# Patient Record
Sex: Female | Born: 1975 | Race: Asian | Hispanic: No | Marital: Married | Smoking: Never smoker
Health system: Southern US, Community
[De-identification: ages and names within clinical notes are randomized; demographics above are authoritative.]

## PROBLEM LIST (undated history)

## (undated) ENCOUNTER — Emergency Department: Admission: EM | Payer: MEDICAID | Source: Home / Self Care

## (undated) DIAGNOSIS — D649 Anemia, unspecified: Secondary | ICD-10-CM

## (undated) DIAGNOSIS — M5020 Other cervical disc displacement, unspecified cervical region: Secondary | ICD-10-CM

## (undated) DIAGNOSIS — Z227 Latent tuberculosis: Secondary | ICD-10-CM

## (undated) DIAGNOSIS — E237 Disorder of pituitary gland, unspecified: Secondary | ICD-10-CM

## (undated) HISTORY — DX: Latent tuberculosis: Z22.7

## (undated) HISTORY — DX: Anemia, unspecified: D64.9

## (undated) HISTORY — DX: Other cervical disc displacement, unspecified cervical region: M50.20

## (undated) HISTORY — DX: Disorder of pituitary gland, unspecified: E23.7

## (undated) HISTORY — PX: DILATION AND CURETTAGE OF UTERUS: SHX78

---

## 2000-12-14 HISTORY — PX: LAPAROSCOPIC OOPHERECTOMY: SHX6507

## 2019-12-15 HISTORY — PX: OVARIAN CYST DRAINAGE: SHX325

## 2021-08-06 ENCOUNTER — Other Ambulatory Visit: Payer: Self-pay

## 2021-08-06 ENCOUNTER — Ambulatory Visit (LOCAL_COMMUNITY_HEALTH_CENTER): Payer: Self-pay

## 2021-08-06 VITALS — Wt 132.3 lb

## 2021-08-06 DIAGNOSIS — R7612 Nonspecific reaction to cell mediated immunity measurement of gamma interferon antigen response without active tuberculosis: Secondary | ICD-10-CM

## 2021-08-07 DIAGNOSIS — R7612 Nonspecific reaction to cell mediated immunity measurement of gamma interferon antigen response without active tuberculosis: Secondary | ICD-10-CM | POA: Insufficient documentation

## 2021-08-11 DIAGNOSIS — Z227 Latent tuberculosis: Secondary | ICD-10-CM | POA: Insufficient documentation

## 2021-08-11 NOTE — Progress Notes (Signed)
EPI completed via phone with Arabic interpeter [phone].  Referred by [facility] Patient had +Tspot and negative CXR.  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 Patient would like LTBI tx but has transportation problems. States has not had a menstrual period in 3-4 months but is not on [facility] and is sexually active with husband. Explained that patient cannot be pregnant while taking INH.  Rifampin is on a state shortage currently.  INH offered. Denies medical hx other than a herniated disc and pituitary gland problem that has caused her to lose some weight.  Patient reported this to [facility] also.   TB RN will reach out to Isabelle Course, patient's case manager with [employer] to see if she can assist with transportation.Richmond Campbell, RN   Case managers email returned. Richmond Campbell, RN

## 2021-08-15 ENCOUNTER — Telehealth: Payer: Self-pay

## 2021-08-15 NOTE — Telephone Encounter (Signed)
Note to call patient re: transportation for LTBI tx appts. Needs Arabic interpreter.

## 2021-08-28 ENCOUNTER — Other Ambulatory Visit: Payer: Self-pay | Admitting: Internal Medicine

## 2021-08-29 LAB — HGB A1C W/O EAG: Hgb A1c MFr Bld: 4.3 % — ABNORMAL LOW (ref 4.8–5.6)

## 2021-08-29 LAB — LIPID PANEL W/O CHOL/HDL RATIO
Cholesterol, Total: 57 mg/dL — ABNORMAL LOW (ref 100–199)
HDL: 57 mg/dL (ref >39)
Triglycerides: <9 mg/dL (ref 0–149)

## 2021-08-29 LAB — COMPREHENSIVE METABOLIC PANEL
ALT: 22 IU/L (ref 0–32)
AST: 26 IU/L (ref 0–40)
Albumin/Globulin Ratio: 2.4 — ABNORMAL HIGH (ref 1.2–2.2)
Albumin: 4.7 g/dL (ref 3.8–4.8)
Alkaline Phosphatase: 53 IU/L (ref 44–121)
BUN/Creatinine Ratio: 9 (ref 9–23)
BUN: 7 mg/dL (ref 6–24)
Bilirubin Total: 0.7 mg/dL (ref 0.0–1.2)
CO2: 28 mmol/L (ref 20–29)
Calcium: 9.4 mg/dL (ref 8.7–10.2)
Chloride: 104 mmol/L (ref 96–106)
Creatinine, Ser: 0.74 mg/dL (ref 0.57–1.00)
Globulin, Total: 2 g/dL (ref 1.5–4.5)
Glucose: 95 mg/dL (ref 65–99)
Potassium: 4.8 mmol/L (ref 3.5–5.2)
Sodium: 144 mmol/L (ref 134–144)
Total Protein: 6.7 g/dL (ref 6.0–8.5)
eGFR: 102 mL/min/{1.73_m2} (ref >59)

## 2021-08-29 LAB — CBC WITH DIFFERENTIAL/PLATELET
Basophils Absolute: 0 10*3/uL (ref 0.0–0.2)
Basos: 1 %
EOS (ABSOLUTE): 0.5 10*3/uL — ABNORMAL HIGH (ref 0.0–0.4)
Eos: 12 %
Hematocrit: 36 % (ref 34.0–46.6)
Hemoglobin: 12 g/dL (ref 11.1–15.9)
Immature Grans (Abs): 0 10*3/uL (ref 0.0–0.1)
Immature Granulocytes: 0 %
Lymphocytes Absolute: 1.4 10*3/uL (ref 0.7–3.1)
Lymphs: 36 %
MCH: 27.8 pg (ref 26.6–33.0)
MCHC: 33.3 g/dL (ref 31.5–35.7)
MCV: 83 fL (ref 79–97)
Monocytes Absolute: 0.3 10*3/uL (ref 0.1–0.9)
Monocytes: 8 %
Neutrophils Absolute: 1.8 10*3/uL (ref 1.4–7.0)
Neutrophils: 43 %
Platelets: 205 10*3/uL (ref 150–450)
RBC: 4.32 x10E6/uL (ref 3.77–5.28)
RDW: 14.6 % (ref 11.7–15.4)
WBC: 4 10*3/uL (ref 3.4–10.8)

## 2021-08-29 LAB — HEPATITIS C ANTIBODY: Hep C Virus Ab: <0.1 s/co ratio (ref 0.0–0.9)

## 2021-08-29 LAB — TSH: TSH: 3.15 u[IU]/mL (ref 0.450–4.500)

## 2021-08-29 LAB — T4, FREE: Free T4: 1.3 ng/dL (ref 0.82–1.77)

## 2021-08-29 LAB — VITAMIN D 25 HYDROXY (VIT D DEFICIENCY, FRACTURES): Vit D, 25-Hydroxy: 10.3 ng/mL — ABNORMAL LOW (ref 30.0–100.0)

## 2021-08-30 ENCOUNTER — Other Ambulatory Visit: Payer: Self-pay

## 2021-08-30 ENCOUNTER — Encounter: Payer: Self-pay | Admitting: Internal Medicine

## 2021-08-30 ENCOUNTER — Ambulatory Visit: Payer: Self-pay | Admitting: Internal Medicine

## 2021-08-30 DIAGNOSIS — E611 Iron deficiency: Secondary | ICD-10-CM

## 2021-08-30 DIAGNOSIS — E569 Vitamin deficiency, unspecified: Secondary | ICD-10-CM | POA: Insufficient documentation

## 2021-08-30 DIAGNOSIS — R109 Unspecified abdominal pain: Secondary | ICD-10-CM | POA: Insufficient documentation

## 2021-08-30 DIAGNOSIS — L80 Vitiligo: Secondary | ICD-10-CM

## 2021-08-30 DIAGNOSIS — R0789 Other chest pain: Secondary | ICD-10-CM

## 2021-08-30 DIAGNOSIS — F419 Anxiety disorder, unspecified: Secondary | ICD-10-CM | POA: Insufficient documentation

## 2021-08-30 DIAGNOSIS — R079 Chest pain, unspecified: Secondary | ICD-10-CM | POA: Insufficient documentation

## 2021-08-30 DIAGNOSIS — R0689 Other abnormalities of breathing: Secondary | ICD-10-CM | POA: Insufficient documentation

## 2021-08-30 DIAGNOSIS — R52 Pain, unspecified: Secondary | ICD-10-CM | POA: Insufficient documentation

## 2021-08-30 MED ORDER — OMEPRAZOLE 20 MG PO CPDR: 20.0000 mg | DELAYED_RELEASE_CAPSULE | Freq: Every day | ORAL | 3 refills | Status: AC

## 2021-08-30 MED ORDER — CITALOPRAM HYDROBROMIDE 10 MG PO TABS: 10.0000 mg | ORAL_TABLET | Freq: Every day | ORAL | 3 refills | Status: AC

## 2021-08-30 NOTE — Progress Notes (Signed)
   New Patient Office Visit  Subjective:  Patient ID: Whitney [name], female    DOB: [birth year]  Age: 45 y.o. MRN: [id number]  CC:  Chief Complaint  Patient presents with   Pain    Pt reports pain in many parts of the body(shoulders, legs, back, neck, etc.)    HPI Patient presents for anxiety, heart burn, chest pain, muscle tension  Past Medical History:  Diagnosis Date   Anemia     No current outpatient medications on file.   Past Surgical History:  Procedure Laterality Date   LAPAROSCOPIC OOPHERECTOMY Left 2002   OVARIAN CYST DRAINAGE  2021    History reviewed. No pertinent family history.  Social History   Socioeconomic History   Marital status: Married    Spouse name: Not on file   Number of children: Not on file   Years of education: Not on file   Highest education level: Not on file  Occupational History   Not on file  Tobacco Use   Smoking status: Never   Smokeless tobacco: Never  Substance and Sexual Activity   Alcohol use: Not on file   Drug use: Never   Sexual activity: Not on file  Other Topics Concern   Not on file  Social History Narrative   Not on file   Social Determinants of Health   Financial Resource Strain: Not on file  Food Insecurity: Not on file  Transportation Needs: Not on file  Physical Activity: Not on file  Stress: Not on file  Social Connections: Not on file  Intimate Partner Violence: Not on file    ROS Review of Systems  Constitutional: Negative.   Gastrointestinal:  Positive for abdominal pain.  Musculoskeletal:  Positive for back pain and neck pain.  Psychiatric/Behavioral:  Positive for behavioral problems.        Anxiety and depression   Objective:   Today's Vitals: BP 126/87 (BP Location: Left Arm, Patient Position: Sitting, Cuff Size: Normal)   Pulse 68   Temp 98.5 F (36.9 C) (Oral)   Ht 5\' 2"  (1.575 m)   Wt 130 lb 3.2 oz (59.1 kg)   LMP 04/13/2021 (Approximate)   SpO2 99%   BMI 23.81 kg/m    Physical Exam Constitutional:      Appearance: Normal appearance.  HENT:     Nose: Nose normal.  Cardiovascular:     Rate and Rhythm: Normal rate.    Assessment & Plan:   Problem List Items Addressed This Visit       Musculoskeletal and Integument   Vitiligo     Other   Pain   Low iron   Vitamin deficiency   Anxiety   Difficulty breathing   Chest pain    No outpatient encounter medications on file as of 08/30/2021.   No facility-administered encounter medications on file as of 08/30/2021.   Ordered vitamin D 5000 units, citalopram 10 mg a day, Prilosec   Follow-up: follow-up in 3 months   09/01/2021, MD

## 2021-10-09 ENCOUNTER — Encounter: Payer: Self-pay | Admitting: Obstetrics and Gynecology

## 2021-10-09 ENCOUNTER — Other Ambulatory Visit: Payer: Self-pay

## 2021-10-22 ENCOUNTER — Other Ambulatory Visit (HOSPITAL_COMMUNITY)
Admission: RE | Admit: 2021-10-22 | Discharge: 2021-10-22 | Disposition: A | Discharge status: Home / Self Care | Payer: Medicaid Other | Source: Ambulatory Visit | Attending: Obstetrics and Gynecology | Admitting: Obstetrics and Gynecology

## 2021-10-22 ENCOUNTER — Other Ambulatory Visit: Payer: Self-pay

## 2021-10-22 ENCOUNTER — Encounter: Payer: Self-pay | Admitting: Obstetrics & Gynecology

## 2021-10-22 ENCOUNTER — Ambulatory Visit (INDEPENDENT_AMBULATORY_CARE_PROVIDER_SITE_OTHER): Payer: Medicaid Other | Admitting: Obstetrics & Gynecology

## 2021-10-22 VITALS — BP 100/70 | Ht 64.96 in | Wt 129.0 lb

## 2021-10-22 DIAGNOSIS — R102 Pelvic and perineal pain: Secondary | ICD-10-CM

## 2021-10-22 DIAGNOSIS — Z124 Encounter for screening for malignant neoplasm of cervix: Secondary | ICD-10-CM | POA: Diagnosis present

## 2021-10-22 DIAGNOSIS — Z8742 Personal history of other diseases of the female genital tract: Secondary | ICD-10-CM

## 2021-10-22 NOTE — Progress Notes (Signed)
Gynecology Pelvic Pain Evaluation   Chief Complaint:  Chief Complaint  Patient presents with   Menstrual Problem   Vaginal Itching   Back Pain   Pelvic Pain    History of Present Illness:   Patient is a 45 y.o. [id number] who LMP was Patient's last menstrual period was 09/19/2021., presents today for a problem visit.  She complains of pain.   Her pain is localized to the RLQ, LLQ, and lower back  area, described as intermittent, dull, and aching, began a week ago and its severity is described as moderate. The pain radiates to the  Non-radiating. She has these associated symptoms which include bloating/abdominal distension and nausea. Patient has these modifiers which include nothing that make it better and unable to associate with any factor that make it worse.  Context includes: prior h/o ovarian cysts, prior surgery including an oophorectomy (unclear which side), prior NSVD x3 7 or more years ago, prior D&C.  New to .  Has irreg periods.  Not on birth control.  OK if became pregnant.  PMHx: She  has a past medical history of Anemia. Also,  has a past surgical history that includes Laparoscopic oopherectomy (Left, 2002); Ovarian cyst drainage (2021); and Dilation and curettage of uterus., family history is not on file.,  reports that she has never smoked. She has never used smokeless tobacco. She reports that she does not drink alcohol and does not use drugs.  She has a current medication list which includes the following prescription(s): citalopram and omeprazole. Also, has No Known Allergies.  Review of Systems  Constitutional:  Negative for chills, fever and malaise/fatigue.  HENT:  Negative for congestion, sinus pain and sore throat.   Eyes:  Negative for blurred vision and pain.  Respiratory:  Negative for cough and wheezing.   Cardiovascular:  Negative for chest pain and leg swelling.  Gastrointestinal:  Positive for abdominal pain. Negative for constipation, diarrhea, heartburn,  nausea and vomiting.  Genitourinary:  Negative for dysuria, frequency, hematuria and urgency.  Musculoskeletal:  Negative for back pain, joint pain, myalgias and neck pain.  Skin:  Negative for itching and rash.  Neurological:  Negative for dizziness, tremors and weakness.  Endo/Heme/Allergies:  Does not bruise/bleed easily.  Psychiatric/Behavioral:  Negative for depression. The patient is not nervous/anxious and does not have insomnia.    Objective: BP 100/70   Ht 5' 4.96" (1.65 m)   Wt 129 lb (58.5 kg)   LMP 09/19/2021   BMI 21.49 kg/m  Physical Exam Constitutional:      General: She is not in acute distress.    Appearance: She is well-developed.  Genitourinary:     Right Labia: No rash or tenderness.    Left Labia: No tenderness or rash.    No vaginal erythema or bleeding.      Right Adnexa: not tender and no mass present.    Left Adnexa: tender.    Left Adnexa: no mass present.    No cervical motion tenderness, discharge, polyp or nabothian cyst.     Uterus is not enlarged.     No uterine mass detected.    Pelvic exam was performed with patient in the lithotomy position.  HENT:     Head: Normocephalic and atraumatic.     Nose: Nose normal.  Abdominal:     General: There is no distension.     Palpations: Abdomen is soft.     Tenderness: There is no abdominal tenderness.  Musculoskeletal:  General: Normal range of motion.  Neurological:     Mental Status: She is alert and oriented to person, place, and time.     Cranial Nerves: No cranial nerve deficit.  Skin:    General: Skin is warm and dry.  Psychiatric:        Attention and Perception: Attention normal.        Mood and Affect: Mood and affect normal.        Speech: Speech normal.        Behavior: Behavior normal.        Thought Content: Thought content normal.        Judgment: Judgment normal.    Female chaperone present for pelvic portion of the physical exam  Assessment: 45 y.o. G3P3003   1.  Pelvic pain - assess for cysts, other Reassuring exam today for large mass - US PELVIC COMPLETE WITH TRANSVAGINAL; Future  2. History of ovarian cyst - US PELVIC COMPLETE WITH TRANSVAGINAL; Future  PAP today Normal exam Monitor pain  Discussed ovarian cysts and their management and follow up  Arabic translator present today   Barnett Applebaum, MD, [facility], [facility] 10/22/2021  3:37 PM

## 2021-10-24 ENCOUNTER — Encounter: Payer: Self-pay | Admitting: Obstetrics & Gynecology

## 2021-10-24 LAB — CYTOLOGY - PAP
Comment: NEGATIVE
Diagnosis: NEGATIVE
High risk HPV: NEGATIVE

## 2021-10-29 ENCOUNTER — Ambulatory Visit: Payer: Medicaid Other

## 2021-10-31 ENCOUNTER — Telehealth: Payer: Self-pay

## 2021-10-31 NOTE — Telephone Encounter (Signed)
Left vm to confirm 11/04/21 appointment-Toni 

## 2021-11-04 ENCOUNTER — Other Ambulatory Visit: Payer: Self-pay

## 2021-11-04 ENCOUNTER — Ambulatory Visit: Payer: Self-pay | Admitting: Nurse Practitioner

## 2021-11-12 ENCOUNTER — Ambulatory Visit: Payer: Medicaid Other | Admitting: Obstetrics & Gynecology

## 2021-11-13 ENCOUNTER — Ambulatory Visit
Admission: RE | Admit: 2021-11-13 | Discharge: 2021-11-13 | Disposition: A | Discharge status: Home / Self Care | Payer: Medicaid Other | Source: Ambulatory Visit | Attending: Obstetrics & Gynecology | Admitting: Obstetrics & Gynecology

## 2021-11-13 ENCOUNTER — Other Ambulatory Visit: Payer: Self-pay

## 2021-11-13 DIAGNOSIS — Z8742 Personal history of other diseases of the female genital tract: Secondary | ICD-10-CM | POA: Insufficient documentation

## 2021-11-13 DIAGNOSIS — R102 Pelvic and perineal pain: Secondary | ICD-10-CM | POA: Insufficient documentation

## 2021-12-02 ENCOUNTER — Ambulatory Visit: Payer: Self-pay | Admitting: Obstetrics & Gynecology

## 2021-12-03 ENCOUNTER — Encounter: Payer: Self-pay | Admitting: Obstetrics & Gynecology

## 2021-12-03 ENCOUNTER — Telehealth: Payer: Self-pay

## 2021-12-03 ENCOUNTER — Ambulatory Visit (INDEPENDENT_AMBULATORY_CARE_PROVIDER_SITE_OTHER): Payer: Medicaid Other | Admitting: Obstetrics & Gynecology

## 2021-12-03 ENCOUNTER — Other Ambulatory Visit: Payer: Self-pay

## 2021-12-03 VITALS — BP 122/70 | Wt 134.0 lb

## 2021-12-03 DIAGNOSIS — R102 Pelvic and perineal pain: Secondary | ICD-10-CM | POA: Diagnosis not present

## 2021-12-03 DIAGNOSIS — Z8742 Personal history of other diseases of the female genital tract: Secondary | ICD-10-CM

## 2021-12-03 DIAGNOSIS — N926 Irregular menstruation, unspecified: Secondary | ICD-10-CM | POA: Diagnosis not present

## 2021-12-03 NOTE — Progress Notes (Signed)
°  HPI: Pt was having RLQ but also flank and side pain.  Still having.  mIld- moderate.  No bleeding.  LMP Oct 8.  Has hot flashes.  Occas nausea.  No breast T.  Pt desires pregnancy.  Ultrasound demonstrates no masses seen, no cysts These findings are Pelvis normal  PMHx: She  has a past medical history of Anemia, Herniated cervical disc, Pituitary abnormality (HCC), and TB lung, latent. Also,  has a past surgical history that includes Laparoscopic oopherectomy (Left, 2002); Ovarian cyst drainage (2021); and Dilation and curettage of uterus., family history is not on file.,  reports that she has never smoked. She has never used smokeless tobacco. She reports that she does not drink alcohol and does not use drugs.  She has a current medication list which includes the following prescription(s): citalopram and omeprazole. Also, has No Known Allergies.  Review of Systems  All other systems reviewed and are negative.  Objective: BP 122/70    Wt 134 lb (60.8 kg)    BMI 22.33 kg/m   Physical examination Constitutional NAD, Conversant  Skin No rashes, lesions or ulceration.   Extremities: Moves all appropriately.  Normal ROM for age. No lymphadenopathy.  Neuro: Grossly intact  Psych: Oriented to PPT.  Normal mood. Normal affect.   No results found.  Assessment:  History of ovarian cyst  Pelvic pain  Irregular periods - Plan: TSH, FSH/LH, Beta hCG quant (ref lab), Estradiol  No GYN source for right sided pain by exams and [place]. Plans f/u w a (new) PCP Also discussed periods and menopause and fertility (as she desires).  Will check labs for such.  With her oligomenorrhea and hot flashes, low likelihood for pregnancy counseled to pt.   Interpreter utilized throughout this visit.  A total of 30 minutes were spent face-to-face with the patient as well as preparation, review, communication, and documentation during this encounter.   Annamarie Major, MD, [facility], [facility] 12/03/2021  11:27 AM

## 2021-12-03 NOTE — Telephone Encounter (Signed)
None.  No hormones desired.  Waiting on labs.  No pain medicine as no gyn cause for pain.  To see PCP next week.

## 2021-12-03 NOTE — Telephone Encounter (Signed)
Pt wondering if there was any medication that [facility] was going to send in from todays visit. Please advise when you can

## 2021-12-03 NOTE — Telephone Encounter (Signed)
Lm with pt

## 2021-12-04 LAB — FSH/LH
FSH: 88.8 m[IU]/mL
LH: 64.2 m[IU]/mL

## 2021-12-04 LAB — ESTRADIOL: Estradiol: <5 pg/mL

## 2021-12-04 LAB — BETA HCG QUANT (REF LAB): hCG Quant: <1 m[IU]/mL

## 2021-12-04 LAB — TSH: TSH: 2.05 u[IU]/mL (ref 0.450–4.500)

## 2021-12-04 NOTE — Progress Notes (Signed)
Need lab results communicated to patient.  Will require language line of some sort to communicate.  If can be set up with you there, then let her know labs are normal, and she is indeed peri-menopausal and very unlikely to achieve pregnancy.

## 2022-01-05 ENCOUNTER — Other Ambulatory Visit: Payer: Self-pay | Admitting: Obstetrics & Gynecology

## 2022-01-13 ENCOUNTER — Ambulatory Visit: Payer: Self-pay | Admitting: Nurse Practitioner

## 2022-01-20 ENCOUNTER — Ambulatory Visit (LOCAL_COMMUNITY_HEALTH_CENTER): Payer: Medicaid Other

## 2022-01-20 ENCOUNTER — Other Ambulatory Visit: Payer: Self-pay

## 2022-01-20 DIAGNOSIS — Z3202 Encounter for pregnancy test, result negative: Secondary | ICD-10-CM | POA: Diagnosis not present

## 2022-01-20 DIAGNOSIS — Z719 Counseling, unspecified: Secondary | ICD-10-CM

## 2022-01-20 DIAGNOSIS — Z23 Encounter for immunization: Secondary | ICD-10-CM | POA: Diagnosis not present

## 2022-01-20 LAB — PREGNANCY, URINE: Preg Test, Ur: NEGATIVE

## 2022-01-20 NOTE — Progress Notes (Signed)
Presented for vaccines need today.  Interpretation provided by [employer] Number [phone] Merna. Patient. reported allergy to tomato, spices, Reported no allergy to egg.  LMP;  4 months ago, but reported that it is normal for her not to have her period because of hormonal issues.   Patient stated several times that she is not pregnant, and that she is sure she is not pregnant.  Consulted with Dr. Alvester Morin regarding LMP.  Dr. Alvester Morin confirm she could receive Hep B, Tdap and Flu Vaccine, but strongly recommends Pregnancy test.  2nd Call place to language line.  Interpretation by badge number [phone].  Patient consent to pregnancy test.  Test results negative.  VIS given to family in arabic.  Vaccines tolerated well. NCIR Updated and copy given to family.     Heinz Eckert R. Edan Serratore Charity fundraiser, Scientist, research (physical sciences)

## 2022-01-30 NOTE — Progress Notes (Signed)
Attestation of Attending Supervision of clinical support staff: I agree with the care provided to this patient and was available for any consultation.  I have reviewed the RN's note and chart. I was available for consult and to see the patient if needed.   Oakes Mccready Niles Dorean Hiebert, MD, MPH, ABFM Attending Physician Faculty Practice- [facility]  

## 2022-02-10 ENCOUNTER — Emergency Department
Admission: EM | Admit: 2022-02-10 | Discharge: 2022-02-10 | Disposition: A | Discharge status: Home / Self Care | Payer: Medicaid Other | Attending: Emergency Medicine | Admitting: Emergency Medicine

## 2022-02-10 ENCOUNTER — Encounter: Payer: Self-pay | Admitting: Emergency Medicine

## 2022-02-10 ENCOUNTER — Other Ambulatory Visit: Payer: Self-pay

## 2022-02-10 DIAGNOSIS — H6983 Other specified disorders of Eustachian tube, bilateral: Secondary | ICD-10-CM

## 2022-02-10 DIAGNOSIS — H6993 Unspecified Eustachian tube disorder, bilateral: Secondary | ICD-10-CM | POA: Diagnosis not present

## 2022-02-10 DIAGNOSIS — H9203 Otalgia, bilateral: Secondary | ICD-10-CM | POA: Diagnosis present

## 2022-02-10 MED ORDER — PREDNISONE 10 MG PO TABS: ORAL_TABLET | ORAL | 0 refills | Status: AC

## 2022-02-10 MED ORDER — FLUTICASONE PROPIONATE 50 MCG/ACT NA SUSP: 2.0000 | Freq: Every day | NASAL | 0 refills | Status: AC

## 2022-02-10 NOTE — Discharge Instructions (Addendum)
Make an appoint with Dr. Tami Ribas about your ears. Begin taking medication that was sent to the pharmacy. You may continue with the amoxicillin and Tylenol.  OPTIONS FOR DENTAL FOLLOW UP CARE  [place] [facility] and [place] [employer] [phone])  [facility] [phone])  [place] [phone])  [place] [phone])  [facility] [phone]) This clinic caters to the indigent population and is on a lottery system. Location: [employer] [facility], [place], [place], [place] Clinic Hours: Wednesdays from 6pm - 9pm, patients seen by a lottery system. For dates, call or go to [email or web address] Services: Cleanings, fillings and simple extractions. Payment Options: DENTAL WORK IS FREE OF CHARGE. Bring proof of income or support. Best way to get seen: Arrive at 5:15 pm - this is a lottery, NOT first come/first serve, so arriving earlier will not increase your chances of being seen.     [facility] [phone] Select option 1 for emergencies   Location: [facility], [place], [address], [place] Clinic Hours: No walk-ins accepted - call the day before to schedule an appointment. Check in times are 9:30 am and 1:30 pm. Services: Simple extractions, temporary fillings, pulpectomy/pulp debridement, uncomplicated abscess drainage. Payment Options: PAYMENT IS DUE AT THE TIME OF SERVICE.  Fee is usually $100-200, additional surgical procedures (e.g. abscess drainage) may be extra. Cash, checks, Visa/MasterCard accepted.  Can file Medicaid if patient is covered for dental - patient should call case worker to check. No discount for [facility] patients. Best way to get seen: MUST call the  day before and get onto the schedule. Can usually be seen the next 1-2 days. No walk-ins accepted.     [place] [phone]   Location: [place], [place] Clinic Hours: M, W, Th, F 8am or 1:30pm, Tues 9a or 1:30 - first come/first served. Services: Simple extractions, temporary fillings, uncomplicated abscess drainage.  You do not need to be an [facility] resident. Payment Options: PAYMENT IS DUE AT THE TIME OF SERVICE. Dental insurance, otherwise sliding scale - bring proof of income or support. Depending on income and treatment needed, cost is usually $50-200. Best way to get seen: Arrive early as it is first come/first served.     [facility] [phone]   Location: [place] Clinic Hours: Mon-Thu 8a-5p Services: Most basic dental services including extractions and fillings. Payment Options: PAYMENT IS DUE AT THE TIME OF SERVICE. Sliding scale, up to 50% off - bring proof if income or support. Medicaid with dental option accepted. Best way to get seen: Call to schedule an appointment, can usually be seen within 2 weeks OR they will try to see walk-ins - show up at [place] or 2p (you may have to wait).     [facility] [place] RESIDENTS ONLY   Location: [facility], [place] [address] [place],  [address] Clinic Hours: By appointment only. Monday - Thursday 8am-5pm, Friday 8am-12pm Services: Cleanings, fillings, extractions. Payment Options: PAYMENT IS DUE AT THE TIME OF SERVICE. Cash, Visa or MasterCard. Sliding scale - $30 minimum per service. Best way to get seen: Come in to office, complete packet and make an appointment - need proof of income or support monies for each household member and proof of  [facility] residence. Usually takes about a month to get in.     [facility] [phone]   Location: [address], [place] Clinic Hours: Walk-in Urgent Care Dental Services are offered Monday-Friday mornings only. The numbers of emergencies accepted daily is limited to the number of providers available. Maximum 15 - Mondays, Wednesdays & Thursdays Maximum 10 - Tuesdays & Fridays Services: You do not need to be a [facility] resident to be seen for a dental emergency. Emergencies are defined as pain, swelling, abnormal bleeding, or dental trauma. Walkins will receive x-rays if needed. NOTE: Dental cleaning is not an emergency. Payment Options: PAYMENT IS DUE AT THE TIME OF SERVICE. Minimum co-pay is $40.00 for uninsured patients. Minimum co-pay is $3.00 for Medicaid with dental coverage. Dental Insurance is accepted and must be presented at time of visit. Medicare does not cover dental. Forms of payment: Cash, credit card, checks. Best way to get seen: If not previously registered with the clinic, walk-in dental registration begins at 7:15 am and is on a first come/first serve basis. If previously registered with the clinic, call to make an appointment.     [facility] [place] ONLY   Location: [address] [place], [place] Clinic Hours: Mon-Thu 10a-2p Services: Extractions only! Payment Options: FREE (donations accepted) - bring proof of income or support Best way to get seen: Call and schedule an appointment OR come at 8am on the 1st Monday of every month (except for holidays) when it is first come/first served.     [facility] [phone]   Location: [place], [place] Clinic Hours: Friday mornings Services, Payment Options, Best way to get seen: Call for info

## 2022-02-10 NOTE — ED Notes (Signed)
See triage note  presents with left ear pain  states pain started about 2 weeks ago    thinks it is worse  also has headache  no fever

## 2022-02-10 NOTE — ED Triage Notes (Signed)
Pt here with left ear pain that started 2 weeks ago. Pt also has a headache and states she feels clogged like she cannot hear well. Pt states she went to a clinic and they told her that she needs to have fluid drained from her ear. Pt in NAD in triage.

## 2022-02-10 NOTE — ED Provider Notes (Signed)
[facility] Provider Note    Event Date/Time   First MD Initiated Contact with Patient 02/10/22 0920     (approximate)   History   Otalgia   HPI Arabic interpreter via [employer]  Whitney [name] is a 46 y.o. female presents to the ED with complaint of bilateral ear pain although the left ear hurts worse than the right.  Patient states that this started 2 weeks ago.  Patient states that her ears feel clogged and she has a headache plus hearing has decreased.  She reports that she went to a clinic where they gave her drops to place in her ear when this did not work she return to the clinic and they placed her on antibiotics which also are not helping.  She was told that she would need to see a specialist who would stick a needle in her ear and drawl the fluid off her ear.  Patient has a history of latent TB, pituitary abnormality, herniated cervical disc and anemia.  Currently she rates her pain as a 10/10.      Physical Exam   Triage Vital Signs: ED Triage Vitals  Enc Vitals Group     BP 02/10/22 0914 126/80     Pulse Rate 02/10/22 0914 75     Resp 02/10/22 0914 16     Temp 02/10/22 0914 98.6 F (37 C)     Temp Source 02/10/22 0914 Oral     SpO2 02/10/22 0914 100 %     Weight 02/10/22 0915 132 lb 4.4 oz (60 kg)     Height 02/10/22 0915 5\' 4"  (1.626 m)     Head Circumference --      Peak Flow --      Pain Score 02/10/22 0914 10     Pain Loc --      Pain Edu? --      Excl. in GC? --     Most recent vital signs: Vitals:   02/10/22 0914  BP: 126/80  Pulse: 75  Resp: 16  Temp: 98.6 F (37 C)  SpO2: 100%     General: Awake, no distress.  CV:  Good peripheral perfusion.  Heart regular rate and rhythm without murmur. Resp:  Normal effort.  Lungs are clear bilaterally. Abd:  No distention.  Other:  EACs are clear.  TMs are dull with poor light reflex but no erythema or injection is noted.  Patient reports that she also feels fluid in her ears  as it "bubbles".   ED Results / Procedures / Treatments   Labs (all labs ordered are listed, but only abnormal results are displayed) Labs Reviewed - No data to display    PROCEDURES:  Critical Care performed:   Procedures   MEDICATIONS ORDERED IN ED: Medications - No data to display   IMPRESSION / MDM / ASSESSMENT AND PLAN / ED COURSE  I reviewed the triage vital signs and the nursing notes.   Differential diagnosis includes, but is not limited to, otitis media, serous otitis, eustachian tube dysfunction.  46 year old female presents to the ED with complaint of bilateral ear pain for approximately 2 weeks.  Patient has been using prescribed eardrops and antibiotics without any relief.  She describes it as a fluid type bubbling sensation with decreased hearing.  On exam there is no evidence of otitis media and patient was made aware.  We discussed in length that the treatment at this point is for prednisone and Flonase steroid nasal  spray to help open up the eustachian tube which should help.  She was told that she could continue taking the antibiotic that she just recently got filled however the Cortisporin otic suspension will not help with her ears.  She was also given contact information for Dr. Jenne Campus who is on-call for ENT as she wants to still see a specialist about her hearing.        FINAL CLINICAL IMPRESSION(S) / ED DIAGNOSES   Final diagnoses:  Eustachian tube dysfunction, bilateral     Rx / DC Orders   ED Discharge Orders          Ordered    predniSONE (DELTASONE) 10 MG tablet        02/10/22 1112    fluticasone (FLONASE) 50 MCG/ACT nasal spray  Daily        02/10/22 1112             Note:  This document was prepared using Dragon voice recognition software and may include unintentional dictation errors.   Tommi Rumps, PA-C 02/10/22 1311    Arnaldo Natal, MD 02/10/22 [phone]

## 2022-02-14 ENCOUNTER — Encounter: Payer: Self-pay | Admitting: Internal Medicine

## 2022-02-14 ENCOUNTER — Ambulatory Visit: Payer: Self-pay | Admitting: Internal Medicine

## 2022-02-14 ENCOUNTER — Other Ambulatory Visit: Payer: Self-pay

## 2022-02-14 ENCOUNTER — Other Ambulatory Visit: Payer: Self-pay | Admitting: Internal Medicine

## 2022-02-14 DIAGNOSIS — H65193 Other acute nonsuppurative otitis media, bilateral: Secondary | ICD-10-CM

## 2022-02-14 DIAGNOSIS — R042 Hemoptysis: Secondary | ICD-10-CM | POA: Insufficient documentation

## 2022-02-14 DIAGNOSIS — H6690 Otitis media, unspecified, unspecified ear: Secondary | ICD-10-CM

## 2022-02-14 DIAGNOSIS — R454 Irritability and anger: Secondary | ICD-10-CM

## 2022-02-14 DIAGNOSIS — L299 Pruritus, unspecified: Secondary | ICD-10-CM

## 2022-02-14 MED ORDER — CIPROFLOXACIN-DEXAMETHASONE 0.3-0.1 % OT SUSP: 4.0000 [drp] | Freq: Two times a day (BID) | OTIC | 0 refills | Status: DC

## 2022-02-14 MED ORDER — CIPROFLOXACIN-DEXAMETHASONE 0.3-0.1 % OT SUSP: 4.0000 [drp] | Freq: Two times a day (BID) | OTIC | Status: DC

## 2022-02-14 NOTE — Progress Notes (Signed)
? ?Established Patient Office Visit ? ?Subjective:  ?Patient ID: Whitney [name], female    DOB: [birth year]  Age: 46 y.o. MRN: [id number] ? ?CC:  ?Chief Complaint  ?Patient presents with  ? Sinus Problem  ? Sinusitis  ? GI Problem  ?  Occurring for a week  ? ? ?HPI ?Whitney [name] presents for bilateral ear pain and drainage including blood.Left ear pain chronic but right more recent onset. Admits to nasal congestion, facial fullness as well and loss of hearing. Went to [facility] last week and prescribed PSE and amoxicillin but states it'Teagen Bucio ineffective. ? ?Past Medical History:  ?Diagnosis Date  ? Anemia   ? Herniated cervical disc   ? 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 TB lung, latent   ? ? ?Past Surgical History:  ?Procedure Laterality Date  ? DILATION AND CURETTAGE OF UTERUS    ? LAPAROSCOPIC OOPHERECTOMY Left 2002  ? OVARIAN CYST DRAINAGE  2021  ? ? ?History reviewed. No pertinent family history. ? ?Social History  ? ?Socioeconomic History  ? Marital status: Married  ?  Spouse name: Not on file  ? Number of children: Not on file  ? Years of education: Not on file  ? Highest education level: Not on file  ?Occupational History  ? Not on file  ?Tobacco Use  ? Smoking status: Never  ? Smokeless tobacco: Never  ?Vaping Use  ? Vaping Use: Never used  ?Substance and Sexual Activity  ? Alcohol use: Never  ? Drug use: Never  ? Sexual activity: Yes  ?  Partners: Male  ?  Birth control/protection: None  ?Other Topics Concern  ? Not on file  ?Social History Narrative  ? ** Merged History Encounter **  ?    ? ?Social Determinants of Health  ? ?Financial Resource Strain: Not on file  ?Food Insecurity: Not on file  ?Transportation Needs: Not on file  ?Physical Activity: Not on file  ?Stress: Not on file  ?Social Connections: Not on file  ?Intimate Partner Violence: Not on file  ? ? ?Outpatient Medications Prior to Visit  ?Medication Sig Dispense Refill  ? amoxicillin (AMOXIL) 500 MG capsule Take 500 mg by mouth 3 (three) times  daily.    ? neomycin-bacitracin-polymyxin (NEOSPORIN) OINT Apply 1 application topically in the morning, at noon, and at bedtime. 3 drops each ear, in the morning then again at noon then again in the evening then again before bed. For 10 days.    ? citalopram (CELEXA) 10 MG tablet Take 1 tablet (10 mg total) by mouth daily. (Patient not taking: Reported on 10/22/2021) 30 tablet 3  ? fluticasone (FLONASE) 50 MCG/ACT nasal spray Place 2 sprays into both nostrils daily. 16 mL 0  ? omeprazole (PRILOSEC) 20 MG capsule Take 1 capsule (20 mg total) by mouth daily. (Patient not taking: Reported on 10/22/2021) 30 capsule 3  ? predniSONE (DELTASONE) 10 MG tablet Take 6 tablets  today, on day 2 take 5 tablets, day 3 take 4 tablets, day 4 take 3 tablets, day 5 take  2 tablets and 1 tablet the last day 21 tablet 0  ? ?No facility-administered medications prior to visit.  ? ? ?No Known Allergies ? ?ROS ?Review of Systems  ?All other systems reviewed and are negative. ? ?  ?Objective:  ?  ?Physical Exam ?HENT:  ?   Right Ear: Hearing normal. There is no impacted cerumen.  ?   Left Ear: Hearing normal. There is no impacted cerumen.  ?  Ears:  ?   Comments: Right tm with blood in ear canal ?Left tm minimal inflammation ?Neurological:  ?   Mental Status: She is alert.  ? ? ?BP 111/78 (BP Location: Left Arm, Patient Position: Sitting, Cuff Size: Normal)   Pulse 71   Temp 98.6 ?F (37 ?C)   Ht 5' 3"  (1.6 m)   Wt 133 lb 6.4 oz (60.5 kg)   SpO2 98%   BMI 23.63 kg/m?  ?Wt Readings from Last 3 Encounters:  ?02/14/22 133 lb 6.4 oz (60.5 kg)  ?02/10/22 132 lb 4.4 oz (60 kg)  ?12/03/21 134 lb (60.8 kg)  ? ? ? ?Health Maintenance Due  ?Topic Date Due  ? HIV Screening  Never done  ? ? ?There are no preventive care reminders to display for this patient. ? ?Lab Results  ?Component Value Date  ? TSH 2.050 12/03/2021  ? ?Lab Results  ?Component Value Date  ? WBC 4.0 08/28/2021  ? HGB 12.0 08/28/2021  ? HCT 36.0 08/28/2021  ? MCV 83 08/28/2021   ? PLT 205 08/28/2021  ? ?Lab Results  ?Component Value Date  ? NA 144 08/28/2021  ? K 4.8 08/28/2021  ? CO2 28 08/28/2021  ? GLUCOSE 95 08/28/2021  ? BUN 7 08/28/2021  ? CREATININE 0.74 08/28/2021  ? BILITOT 0.7 08/28/2021  ? ALKPHOS 53 08/28/2021  ? AST 26 08/28/2021  ? ALT 22 08/28/2021  ? PROT 6.7 08/28/2021  ? ALBUMIN 4.7 08/28/2021  ? CALCIUM 9.4 08/28/2021  ? EGFR 102 08/28/2021  ? ?Lab Results  ?Component Value Date  ? CHOL 57 (L) 08/28/2021  ? ?Lab Results  ?Component Value Date  ? HDL 57 08/28/2021  ? ?Lab Results  ?Component Value Date  ? [place] CANCELED 08/28/2021  ? ?Lab Results  ?Component Value Date  ? TRIG <9 08/28/2021  ? ?No results found for: CHOLHDL ?Lab Results  ?Component Value Date  ? HGBA1C 4.3 (L) 08/28/2021  ? ? ?  ?Assessment & Plan:  ? ?Problem List Items Addressed This Visit   ? ?  ? Other  ? Coughing up blood  ? Irritability and anger  ? Itching  ? ? ?No orders of the defined types were placed in this encounter. ? ? ?Follow-up: No follow-ups on file.  ? ? ?Volanda Napoleon, MD ?

## 2022-02-14 NOTE — Addendum Note (Signed)
Addended by: Orville Govern on: 02/14/2022 03:13 PM ? ? Modules accepted: Orders ? ?

## 2022-04-30 ENCOUNTER — Ambulatory Visit: Payer: Medicaid Other | Admitting: Nurse Practitioner

## 2022-06-09 ENCOUNTER — Ambulatory Visit: Payer: Medicaid Other | Admitting: Nurse Practitioner

## 2022-06-11 ENCOUNTER — Ambulatory Visit: Payer: Medicaid Other | Admitting: Internal Medicine

## 2022-06-15 ENCOUNTER — Encounter: Payer: Self-pay | Admitting: Internal Medicine

## 2022-06-15 ENCOUNTER — Ambulatory Visit: Payer: Medicaid Other | Admitting: Internal Medicine

## 2022-06-15 VITALS — BP 106/70 | HR 73 | Temp 97.9°F | Resp 16 | Ht 61.25 in | Wt 127.4 lb

## 2022-06-15 DIAGNOSIS — L259 Unspecified contact dermatitis, unspecified cause: Secondary | ICD-10-CM

## 2022-06-15 DIAGNOSIS — H1010 Acute atopic conjunctivitis, unspecified eye: Secondary | ICD-10-CM

## 2022-06-15 DIAGNOSIS — L80 Vitiligo: Secondary | ICD-10-CM

## 2022-06-15 DIAGNOSIS — J309 Allergic rhinitis, unspecified: Secondary | ICD-10-CM | POA: Diagnosis not present

## 2022-06-15 DIAGNOSIS — M25561 Pain in right knee: Secondary | ICD-10-CM

## 2022-06-15 DIAGNOSIS — N926 Irregular menstruation, unspecified: Secondary | ICD-10-CM

## 2022-06-15 DIAGNOSIS — K219 Gastro-esophageal reflux disease without esophagitis: Secondary | ICD-10-CM

## 2022-06-15 DIAGNOSIS — G8929 Other chronic pain: Secondary | ICD-10-CM

## 2022-06-15 HISTORY — DX: Vitiligo: L80

## 2022-06-15 LAB — POCT PREGNANCY, URINE

## 2022-06-15 MED ORDER — BETAMETHASONE DIPROPIONATE AUG 0.05 % EX OINT: TOPICAL_OINTMENT | CUTANEOUS | 1 refills | Status: AC

## 2022-06-15 MED ORDER — FAMOTIDINE 20 MG PO TABS: 20.0000 mg | ORAL_TABLET | Freq: Two times a day (BID) | ORAL | 1 refills | Status: AC

## 2022-06-15 MED ORDER — LORATADINE 10 MG PO TABS: ORAL_TABLET | ORAL | 11 refills | Status: AC

## 2022-06-15 MED ORDER — DICLOFENAC SODIUM 1 % EX GEL: 2.0000 g | Freq: Two times a day (BID) | CUTANEOUS | 3 refills | Status: AC | PRN

## 2022-06-15 MED ORDER — MONTELUKAST SODIUM 10 MG PO TABS: 10.0000 mg | ORAL_TABLET | Freq: Every day | ORAL | 3 refills | Status: AC

## 2022-06-18 ENCOUNTER — Encounter: Payer: Self-pay | Admitting: Internal Medicine

## 2022-07-07 ENCOUNTER — Ambulatory Visit (INDEPENDENT_AMBULATORY_CARE_PROVIDER_SITE_OTHER): Payer: Medicaid Other | Admitting: Family Medicine

## 2022-07-07 ENCOUNTER — Encounter: Payer: Self-pay | Admitting: Family Medicine

## 2022-07-07 VITALS — BP 108/70 | Wt 129.0 lb

## 2022-07-07 DIAGNOSIS — N644 Mastodynia: Secondary | ICD-10-CM

## 2022-07-07 DIAGNOSIS — N926 Irregular menstruation, unspecified: Secondary | ICD-10-CM | POA: Diagnosis not present

## 2022-07-07 DIAGNOSIS — N3000 Acute cystitis without hematuria: Secondary | ICD-10-CM

## 2022-07-07 DIAGNOSIS — R102 Pelvic and perineal pain: Secondary | ICD-10-CM

## 2022-07-07 DIAGNOSIS — Z1231 Encounter for screening mammogram for malignant neoplasm of breast: Secondary | ICD-10-CM

## 2022-07-07 LAB — POCT URINALYSIS DIPSTICK
Bilirubin, UA: NEGATIVE
Blood, UA: NEGATIVE
Glucose, UA: NEGATIVE
Ketones, UA: POSITIVE
Nitrite, UA: NEGATIVE
Protein, UA: NEGATIVE
Spec Grav, UA: 1.01 (ref 1.010–1.025)
Urobilinogen, UA: 0.2 E.U./dL
pH, UA: 6 (ref 5.0–8.0)

## 2022-07-07 MED ORDER — MEDROXYPROGESTERONE ACETATE 10 MG PO TABS: 10.0000 mg | ORAL_TABLET | Freq: Every day | ORAL | 2 refills | Status: DC

## 2022-07-07 MED ORDER — LETROZOLE 2.5 MG PO TABS: 2.5000 mg | ORAL_TABLET | Freq: Every day | ORAL | 2 refills | Status: AC

## 2022-07-07 NOTE — Progress Notes (Signed)
  GYNECOLOGY PROBLEM  VISIT ENCOUNTER NOTE  Subjective:   Whitney [name] is a 46 y.o. [phone]  female here for a problem GYN visit.  Current complaints: multiple things  # pelvic pain: R>L for years. Reports also associated with back pain. Reports more with cycle.   Denies abnormal vaginal bleeding, discharge, pelvic pain, problems with intercourse or other gynecologic concerns.   #irregular cycles: has had these for years and required medication to get pregnant. Reports prior to July menses it had been 3 months since last cycle. She does desires another pregnancy.   #fertility- desires another pregnancy. Husband had to take medication as well to increase sperm count. She reports taking a medication to stimulate ovulation for all her pregnancies. She has had three uncomplicated pregnancies otherwise, with three vaginal births.   # breast pain/itching- happens with her menstrual cycle. For her whole life.   Utilized the help of arabic interpreter Maralyn Sago throughout visit   Gynecologic History Patient's last menstrual period was 06/19/2022 (exact date).  Contraception: none  Health Maintenance Due  Topic Date Due   HIV Screening  Never done   COVID-19 Vaccine (3 - Pfizer series) 04/06/2021    The following portions of the patient's history were reviewed and updated as appropriate: allergies, current medications, past family history, past medical history, past social history, past surgical history and problem list.  Review of Systems Pertinent items are noted in HPI.   Objective:  BP 108/70   Wt 129 lb (58.5 kg)   LMP 06/19/2022 (Exact Date)   BMI 24.18 kg/m  Gen: well appearing, NAD HEENT: no scleral icterus CV: RR Lung: Normal WOB Ext: warm well perfused  PELVIC: chaperone present. Normal appearing external genitalia; normal appearing vaginal mucosa and cervix.  No abnormal discharge noted.  .  Normal uterine size, no other palpable masses, no uterine or adnexal  tenderness.   Assessment and Plan:  1. Acute cystitis without hematuria - POCT Urinalysis Dipstick  2. Irregular periods/menstrual cycles Reviewed in detail how to take medications Also reporte that her husband needed medication to achieve pregnancy in the past. Given PCP list for him to reach out for services Discussed tracking cycles and bleeding Recommended urine pregnancy test after stimulating ovulation - medroxyPROGESTERone (PROVERA) 10 MG tablet; Take 1 tablet (10 mg total) by mouth daily. Use for ten days  Dispense: 10 tablet; Refill: 2 - letrozole (FEMARA) 2.5 MG tablet; Take 1 tablet (2.5 mg total) by mouth daily. Take on days 3 to 7 following a spontaneous menses or progestin-induced bleed.  Dispense: 5 tablet; Refill: 2  3. Pelvic pain - US PELVIC COMPLETE WITH TRANSVAGINAL; Future  4. Breast pain - MM 3D SCREEN BREAST BILATERAL; Future  5. Screening mammogram for breast cancer - MM 3D SCREEN BREAST BILATERAL; Future  Please refer to After Visit Summary for other counseling recommendations.   Face to face time:  45 min  Greater than 50% of the visit time was spent in counseling and coordination of care with the patient.  The summary and outline of the counseling and care coordination is summarized in the note above.  All questions were answered.  Return in about 3 months (around 10/07/2022) for f/u fertility and irregular cycles.  Federico Flake, MD, MPH, ABFM Attending Physician Faculty Practice- [facility]

## 2022-07-07 NOTE — Patient Instructions (Addendum)
AREA FAMILY PRACTICE PHYSICIANS  [place] ([address]) [facility] [address] [place], [place] [address] [phone] Mon-Fri 8:30-12:30, 1:30-5:00 Accepting Medicaid [facility] [address], [place], [place] [address] [phone] Mon-Fri 8:00-5:30 [facility] [address] [place], [place] [address] [phone] Mon, Tue, Thur, Fri 8:30-5:00, Wed 10:00-7:00 (closed 1-2pm) Accepting Medicaid [facility] [address], [place], [place]  [address] Phone - [phone]   Fax - [phone]  [place] ([address]) [facility] [address] [place], [place] [address] [phone] Mon-Fri 8:00-5:00 [facility] ([facility])  [address] [place], [place] [address] [phone] Mon-Fri 8:30-5:30, Sat (Oct.-Mar.) 9:00-1:00 Accepting Medicaid  [place] ([address]) [facility] [address], [place], [place] [address] [phone] Mon-Fri 8:00-5:00  [place] ([address]) [facility] [address], [place], [place] [address] [phone] Mon-Fri 8:00-5:00 [facility] [address], [place], [place] [address] [phone] Mon-Fri 8:00-5:00 [facility] [address] [place], [place] [address] [phone] Mon-Fri 8:00-5:00 [facility] [address] [place] [address] [phone] Mon-Fri 7:30-5:30  [place] ([address] & [address]) [facility] [address] [place], [place] [address] [phone] Mon-Thur 8:00-6:00 Accepting Medicaid [facility] [address] [place], [place] [address] [phone] Mon-Thur 7:30-7:30, Fri 7:30-4:30 Accepting  Medicaid [facility] [address], [place], [place]  [address] [phone]   Fax - [phone]  [place] ([address] & [address]) [facility] [address] [place], [place] [address] [phone] Mon-Fri 7:00-5:00 [facility] [address], [place], [place] [address] [phone] Mon-Fri 8:00-5:00 Accepting Medicaid [facility] [address], [place], [place] [address] [phone] Mon-Fri 8:00-5:00 Accepting Medicaid  [place] ([address]) [facility] [place] [address] [place], [place] [address] [phone] Mon-Fri 8:00-5:00 [facility] [address], [place], [place] [address] [phone] Mon-Fri 8:00-5:00 Accepting Medicaid [facility] ([facility] [address], [place], [place] [address] [phone] Mon-Fri 8:00-5:30, Sat&Sun by appointment (phones open at 8:30) Accepting Medicaid  [place] ([address]) [facility] [address] [place], [place] [address] [phone] Mon-Thur 8:00-7:00, Fri 8:00-5:00, Sat 8:00-12:00, Sun 9:00-12:00 Accepting Medicaid [facility] [address], [place], [place] [address] [phone] Mon-Thur 8:00-5:00 Accepting Medicaid [facility] [address] [place], [place] [address] [phone] Mon-Fri 8:00-5:30, Sat (Oct.-Mar.) 9:00-1:00 Accepting Medicaid  [place] ([address]) [facility] [address], [place], [place] [address] [phone] Mon-Fri 8:00-5:00 Accepting Medicaid   [place] ([address]) [facility] [place] [address], [place], [place] [address] [phone] Mon-Fri 8:00-5:00 [facility] [place] [address], [place], [place] [address] [phone] Mon-Fri 8:00-5:00 [facility] [place] [address] [place] [address], [place], [place] [address] [phone] Mon-Fri 8:00-5:00 After hours clinic ([address] [place], [place] [address]) [phone] Mon-Fri 5:00-8:00, Sat 12:00-6:00, Sun 10:00-4:00 Accepting Medicaid [facility] [place]   [address], [place], [place]  [address] [phone]   Fax - [phone]  [place] ([address]) [facility] [place] [address], [place], [place] [address] [phone] Mon-Fri 8:00-5:00 [facility] [place] ([facility] [place]) [address], [place], [place] [address] [phone] Mon-Thur 8:00-7:00, Fri 8:00-5:00, Sat 8:00-12:00    

## 2022-07-10 ENCOUNTER — Encounter: Payer: Self-pay | Admitting: Family Medicine

## 2022-07-17 ENCOUNTER — Ambulatory Visit
Admission: RE | Admit: 2022-07-17 | Discharge: 2022-07-17 | Disposition: A | Discharge status: Home / Self Care | Payer: Medicaid Other | Source: Ambulatory Visit | Attending: Family Medicine | Admitting: Family Medicine

## 2022-07-17 DIAGNOSIS — R102 Pelvic and perineal pain: Secondary | ICD-10-CM | POA: Insufficient documentation

## 2022-08-27 ENCOUNTER — Other Ambulatory Visit: Payer: Self-pay | Admitting: Internal Medicine

## 2022-08-27 DIAGNOSIS — R52 Pain, unspecified: Secondary | ICD-10-CM

## 2022-12-21 ENCOUNTER — Ambulatory Visit: Payer: Self-pay | Admitting: Internal Medicine

## 2022-12-24 ENCOUNTER — Ambulatory Visit (INDEPENDENT_AMBULATORY_CARE_PROVIDER_SITE_OTHER): Payer: Medicaid Other | Admitting: Dermatology

## 2022-12-24 ENCOUNTER — Ambulatory Visit (INDEPENDENT_AMBULATORY_CARE_PROVIDER_SITE_OTHER): Payer: Medicaid Other

## 2022-12-24 DIAGNOSIS — Z79899 Other long term (current) drug therapy: Secondary | ICD-10-CM

## 2022-12-24 DIAGNOSIS — L650 Telogen effluvium: Secondary | ICD-10-CM | POA: Diagnosis not present

## 2022-12-24 DIAGNOSIS — L80 Vitiligo: Secondary | ICD-10-CM

## 2022-12-24 MED ORDER — MOMETASONE FUROATE 0.1 % EX CREA: 1.0000 | TOPICAL_CREAM | Freq: Every day | CUTANEOUS | 2 refills | Status: DC

## 2022-12-24 MED ORDER — OPZELURA 1.5 % EX CREA: 1.0000 "application " | TOPICAL_CREAM | Freq: Every day | CUTANEOUS | 3 refills | Status: DC

## 2022-12-24 NOTE — Patient Instructions (Signed)
Due to recent changes in healthcare laws, you may see results of your pathology and/or laboratory studies on MyChart before the doctors have had a chance to review them. We understand that in some cases there may be results that are confusing or concerning to you. Please understand that not all results are received at the same time and often the doctors may need to interpret multiple results in order to provide you with the best plan of care or course of treatment. Therefore, we ask that you please give us 2 business days to thoroughly review all your results before contacting the office for clarification. Should we see a critical lab result, you will be contacted sooner.   If You Need Anything After Your Visit  If you have any questions or concerns for your doctor, please call our main line at [phone] and press option 4 to reach your doctor's medical assistant. If no one answers, please leave a voicemail as directed and we will return your call as soon as possible. Messages left after 4 pm will be answered the following business day.   You may also send us a message via MyChart. We typically respond to MyChart messages within 1-2 business days.  For prescription refills, please ask your pharmacy to contact our office. Our fax number is [phone].  If you have an urgent issue when the clinic is closed that cannot wait until the next business day, you can page your doctor at the number below.    Please note that while we do our best to be available for urgent issues outside of office hours, we are not available 24/7.   If you have an urgent issue and are unable to reach us, you may choose to seek medical care at your doctor's office, retail clinic, urgent care center, or emergency room.  If you have a medical emergency, please immediately call 911 or go to the emergency department.  Pager Numbers  - Dr. Kowalski: [phone]  - Dr. Moye: [phone]  - Dr. Stewart:  [phone]  In the event of inclement weather, please call our main line at [phone] for an update on the status of any delays or closures.  Dermatology Medication Tips: Please keep the boxes that topical medications come in in order to help keep track of the instructions about where and how to use these. Pharmacies typically print the medication instructions only on the boxes and not directly on the medication tubes.   If your medication is too expensive, please contact our office at [phone] option 4 or send us a message through MyChart.   We are unable to tell what your co-pay for medications will be in advance as this is different depending on your insurance coverage. However, we may be able to find a substitute medication at lower cost or fill out paperwork to get insurance to cover a needed medication.   If a prior authorization is required to get your medication covered by your insurance company, please allow us 1-2 business days to complete this process.  Drug prices often vary depending on where the prescription is filled and some pharmacies may offer cheaper prices.  The website www.goodrx.com contains coupons for medications through different pharmacies. The prices here do not account for what the cost may be with help from insurance (it may be cheaper with your insurance), but the website can give you the price if you did not use any insurance.  - You can print the associated coupon and take it with   your prescription to the pharmacy.  - You may also stop by our office during regular business hours and pick up a GoodRx coupon card.  - If you need your prescription sent electronically to a different pharmacy, notify our office through [facility] MyChart or by phone at [phone] option 4.     Si Usted Necesita Algo Despus de Su Visita  Tambin puede enviarnos un mensaje a travs de MyChart. Por lo general respondemos a los mensajes de MyChart en el transcurso de 1 a 2  das hbiles.  Para renovar recetas, por favor pida a su farmacia que se ponga en contacto con nuestra oficina. Nuestro nmero de fax es el [phone].  Si tiene un asunto urgente cuando la clnica est cerrada y que no puede esperar hasta el siguiente da hbil, puede llamar/localizar a su doctor(a) al nmero que aparece a continuacin.   Por favor, tenga en cuenta que aunque hacemos todo lo posible para estar disponibles para asuntos urgentes fuera del horario de oficina, no estamos disponibles las 24 horas del da, los 7 das de la semana.   Si tiene un problema urgente y no puede comunicarse con nosotros, puede optar por buscar atencin mdica  en el consultorio de su doctor(a), en una clnica privada, en un centro de atencin urgente o en una sala de emergencias.  Si tiene una emergencia mdica, por favor llame inmediatamente al 911 o vaya a la sala de emergencias.  Nmeros de bper  - Dr. Kowalski: [phone]  - Dra. Moye: [phone]  - Dra. Stewart: [phone]  En caso de inclemencias del tiempo, por favor llame a nuestra lnea principal al [phone] para una actualizacin sobre el estado de cualquier retraso o cierre.  Consejos para la medicacin en dermatologa: Por favor, guarde las cajas en las que vienen los medicamentos de uso tpico para ayudarle a seguir las instrucciones sobre dnde y cmo usarlos. Las farmacias generalmente imprimen las instrucciones del medicamento slo en las cajas y no directamente en los tubos del medicamento.   Si su medicamento es muy caro, por favor, pngase en contacto con nuestra oficina llamando al [phone] y presione la opcin 4 o envenos un mensaje a travs de MyChart.   No podemos decirle cul ser su copago por los medicamentos por adelantado ya que esto es diferente dependiendo de la cobertura de su seguro. Sin embargo, es posible que podamos encontrar un medicamento sustituto a menor costo o llenar un formulario para que el  seguro cubra el medicamento que se considera necesario.   Si se requiere una autorizacin previa para que su compaa de seguros cubra su medicamento, por favor permtanos de 1 a 2 das hbiles para completar este proceso.  Los precios de los medicamentos varan con frecuencia dependiendo del lugar de dnde se surte la receta y alguna farmacias pueden ofrecer precios ms baratos.  El sitio web www.goodrx.com tiene cupones para medicamentos de diferentes farmacias. Los precios aqu no tienen en cuenta lo que podra costar con la ayuda del seguro (puede ser ms barato con su seguro), pero el sitio web puede darle el precio si no utiliz ningn seguro.  - Puede imprimir el cupn correspondiente y llevarlo con su receta a la farmacia.  - Tambin puede pasar por nuestra oficina durante el horario de atencin regular y recoger una tarjeta de cupones de GoodRx.  - Si necesita que su receta se enve electrnicamente a una farmacia diferente, informe a nuestra oficina a travs de MyChart de [facility]   o por telfono llamando al [phone] y presione la opcin 4.  

## 2022-12-24 NOTE — Progress Notes (Signed)
   New Patient Visit  Subjective  Whitney [name] is a 47 y.o. female who presents for the following: Other (Vitiligo x ~4 years. She has itchy and her hair is coming out.).  Accompanied by Interpreter  The following portions of the chart were reviewed this encounter and updated as appropriate:   Tobacco  Allergies  Meds  Problems  Med Hx  Surg Hx  Fam Hx     Review of Systems:  No other skin or systemic complaints except as noted in HPI or Assessment and Plan.  Objective  Well appearing patient in no apparent distress; mood and affect are within normal limits.  A full examination was performed including scalp, head, eyes, ears, nose, lips, neck, chest, axillae, abdomen, back, buttocks, bilateral upper extremities, bilateral lower extremities, hands, feet, fingers, toes, fingernails, and toenails. All findings within normal limits unless otherwise noted below.  Depigmented patches of arms, legs, abdomen, back                Assessment & Plan  Vitiligo Severe See photos Counseling through interpreter Vitiligo is a chronic autoimmune condition which causes loss of skin pigment and is commonly seen on the face and may also involve areas of trauma like hands, elbows, knees, and ankles. There is no cure and it is difficult to treat.  Treatments include topical steroids and other topical anti-inflammatory ointments/creams and topical and oral Jak inhibitors.  Sometimes narrow band UV light therapy or Xtrac laser is helpful, both of which require twice weekly treatments for at least 3-6 months.  Antioxidant vitamins, such as Vitamins A,C,E,D, Folic Acid and B12 may be added to enhance treatment.  Start : NBUVB treatments 2 times per week. Opzelura cream qd to hands, wrists and neck,  Mometasone cream qd up to 5 days per week to itchy areas  mometasone (ELOCON) 0.1 % cream Apply 1 Application topically daily. 5 times per week for itchy areas Ruxolitinib Phosphate  (OPZELURA) 1.5 % CREA Apply 1 application  topically daily. To hands,wrists and neck  Telogen effluvium Head - Anterior (Face) Will plan to discuss on follow up  Return today (on 12/24/2022) for 2 days per week for NBUVB, 6 weeks with Dr Nehemiah Massed vitiligo follow up, hair loss.  I, Ashok Cordia, CMA, am acting as scribe for Sarina Ser, MD . Documentation: I have reviewed the above documentation for accuracy and completeness, and I agree with the above.  Sarina Ser, MD

## 2022-12-24 NOTE — Progress Notes (Signed)
Patient here today for lightobox treatment for Vitiligo.  Patient treated front of body for 1 minute and 28 seconds and back of body 2 minute 56 seconds. Patient was advised to treat back one time but she self treated twice for more effective results. Patient questioned heat at check out, advised due to treating back twice she may burn or have pain in that area.   Johnsie Kindred, RMA

## 2022-12-28 ENCOUNTER — Ambulatory Visit (INDEPENDENT_AMBULATORY_CARE_PROVIDER_SITE_OTHER): Payer: Medicaid Other

## 2022-12-28 DIAGNOSIS — L80 Vitiligo: Secondary | ICD-10-CM | POA: Diagnosis not present

## 2022-12-28 NOTE — Progress Notes (Addendum)
Patient here today for lightobox treatment for Vitiligo.   Patient treated front of body for 1 minute and 38 seconds and back of body 1 minute 38 seconds.     Johnsie Kindred, RMA

## 2022-12-29 ENCOUNTER — Encounter: Payer: Self-pay | Admitting: Dermatology

## 2022-12-30 ENCOUNTER — Ambulatory Visit (INDEPENDENT_AMBULATORY_CARE_PROVIDER_SITE_OTHER): Payer: Medicaid Other

## 2022-12-30 DIAGNOSIS — L80 Vitiligo: Secondary | ICD-10-CM

## 2022-12-31 NOTE — Progress Notes (Signed)
Patient here today for lightobox treatment for Vitiligo.   Patient treated front of body for 1 minute and 48 seconds and back of body 1 minute 48 seconds.      Johnsie Kindred, RMA

## 2023-01-04 ENCOUNTER — Ambulatory Visit (INDEPENDENT_AMBULATORY_CARE_PROVIDER_SITE_OTHER): Payer: Medicaid Other

## 2023-01-04 DIAGNOSIS — L80 Vitiligo: Secondary | ICD-10-CM

## 2023-01-04 NOTE — Progress Notes (Signed)
Patient here today for lightobox treatment for Vitiligo.   Patient treated front of body for 1 minute and 58 seconds and back of body 1 minute 58 seconds.      Johnsie Kindred, RMA

## 2023-01-06 ENCOUNTER — Ambulatory Visit: Payer: Self-pay

## 2023-01-07 ENCOUNTER — Ambulatory Visit (INDEPENDENT_AMBULATORY_CARE_PROVIDER_SITE_OTHER): Payer: Medicaid Other

## 2023-01-07 DIAGNOSIS — L80 Vitiligo: Secondary | ICD-10-CM | POA: Diagnosis not present

## 2023-01-11 ENCOUNTER — Ambulatory Visit (INDEPENDENT_AMBULATORY_CARE_PROVIDER_SITE_OTHER): Payer: Medicaid Other

## 2023-01-11 DIAGNOSIS — L80 Vitiligo: Secondary | ICD-10-CM

## 2023-01-11 NOTE — Progress Notes (Signed)
Patient here today for lightobox treatment for Vitiligo.   Patient treated front of body for 2 minutes and 18 seconds and back of body 2 minutes 18 seconds.      Johnsie Kindred, RMA

## 2023-01-11 NOTE — Progress Notes (Signed)
Patient here today for lightobox treatment for Vitiligo.   Patient treated front of body for 2 minutes and 8 seconds and back of body 2 minutes 8 seconds.      Johnsie Kindred, RMA

## 2023-01-12 ENCOUNTER — Telehealth: Payer: Self-pay

## 2023-01-12 NOTE — Telephone Encounter (Signed)
Patient has [facility]. Unfortunately they do not cover [place] for Vitiligo. They will only cover RX with a atopic dermatitis diagnosis.

## 2023-01-13 ENCOUNTER — Ambulatory Visit (INDEPENDENT_AMBULATORY_CARE_PROVIDER_SITE_OTHER): Payer: Medicaid Other

## 2023-01-13 DIAGNOSIS — L80 Vitiligo: Secondary | ICD-10-CM | POA: Diagnosis not present

## 2023-01-13 MED ORDER — TACROLIMUS 0.1 % EX OINT: TOPICAL_OINTMENT | Freq: Two times a day (BID) | CUTANEOUS | 3 refills | Status: DC

## 2023-01-13 NOTE — Telephone Encounter (Signed)
RX sent in. Patient coming in today for lightbox treatment. aw

## 2023-01-13 NOTE — Progress Notes (Signed)
Patient here today for lightobox treatment for Vitiligo.   Patient treated front of body for 2 minutes and 28 seconds and back of body 2 minutes 28 seconds.      Johnsie Kindred, RMA

## 2023-01-18 ENCOUNTER — Ambulatory Visit (INDEPENDENT_AMBULATORY_CARE_PROVIDER_SITE_OTHER): Payer: Medicaid Other

## 2023-01-18 ENCOUNTER — Other Ambulatory Visit: Payer: Self-pay

## 2023-01-18 DIAGNOSIS — L80 Vitiligo: Secondary | ICD-10-CM

## 2023-01-18 MED ORDER — TACROLIMUS 0.1 % EX OINT: TOPICAL_OINTMENT | Freq: Two times a day (BID) | CUTANEOUS | 3 refills | Status: DC

## 2023-01-20 ENCOUNTER — Ambulatory Visit: Payer: Self-pay

## 2023-01-20 DIAGNOSIS — L80 Vitiligo: Secondary | ICD-10-CM

## 2023-01-20 NOTE — Progress Notes (Signed)
Patient here today for lightobox treatment for Vitiligo.   Patient treated front of body for 2 minutes and 48 seconds and back of body 2 minutes 48 seconds.      Johnsie Kindred, RMA

## 2023-01-20 NOTE — Progress Notes (Signed)
Patient here today for lightobox treatment for Vitiligo.   Patient treated front of body for 2 minutes and 38 seconds and back of body 2 minutes 38 seconds.      Johnsie Kindred, RMA

## 2023-01-25 ENCOUNTER — Ambulatory Visit (INDEPENDENT_AMBULATORY_CARE_PROVIDER_SITE_OTHER): Payer: Medicaid Other

## 2023-01-25 DIAGNOSIS — L80 Vitiligo: Secondary | ICD-10-CM | POA: Diagnosis not present

## 2023-01-25 NOTE — Progress Notes (Signed)
Patient here today for lightobox treatment for Vitiligo.   Patient treated front of body for 2 minutes and 58 seconds and back of body 2 minutes 58 seconds.      Johnsie Kindred, RMA

## 2023-01-27 ENCOUNTER — Ambulatory Visit (INDEPENDENT_AMBULATORY_CARE_PROVIDER_SITE_OTHER): Payer: Medicaid Other

## 2023-01-27 ENCOUNTER — Ambulatory Visit: Payer: Self-pay | Admitting: Dermatology

## 2023-01-27 DIAGNOSIS — L80 Vitiligo: Secondary | ICD-10-CM

## 2023-01-27 NOTE — Progress Notes (Signed)
Patient here today for lightobox treatment for Vitiligo.   Patient treated front of body for 3 minutes and 8 seconds and back of body 3 minutes 8 seconds.      Johnsie Kindred, RMA

## 2023-02-01 ENCOUNTER — Ambulatory Visit (INDEPENDENT_AMBULATORY_CARE_PROVIDER_SITE_OTHER): Payer: Medicaid Other

## 2023-02-01 DIAGNOSIS — L80 Vitiligo: Secondary | ICD-10-CM

## 2023-02-01 NOTE — Progress Notes (Signed)
Patient here today for lightobox treatment for Vitiligo.   Patient treated front of body for 3 minutes and 18 seconds and back of body 3 minutes 18 seconds.      Johnsie Kindred, RMA

## 2023-02-03 ENCOUNTER — Ambulatory Visit (INDEPENDENT_AMBULATORY_CARE_PROVIDER_SITE_OTHER): Payer: Medicaid Other

## 2023-02-03 DIAGNOSIS — L80 Vitiligo: Secondary | ICD-10-CM | POA: Diagnosis not present

## 2023-02-03 NOTE — Progress Notes (Signed)
Patient here today for lightobox treatment for Vitiligo.   Patient treated front of body for 3 minutes and 28 seconds and back of body 3 minutes 28 seconds.      Whitney [name], RMA

## 2023-02-08 ENCOUNTER — Ambulatory Visit (INDEPENDENT_AMBULATORY_CARE_PROVIDER_SITE_OTHER): Payer: Medicaid Other

## 2023-02-08 DIAGNOSIS — L80 Vitiligo: Secondary | ICD-10-CM | POA: Diagnosis not present

## 2023-02-08 NOTE — Progress Notes (Signed)
Patient here today for lightobox treatment for Vitiligo.   Patient treated front of body for 3 minutes and 38 seconds and back of body 3 minutes 38 seconds.      Johnsie Kindred, RMA

## 2023-02-10 ENCOUNTER — Encounter: Payer: Self-pay | Admitting: Dermatology

## 2023-02-10 ENCOUNTER — Ambulatory Visit: Payer: Medicaid Other | Admitting: Dermatology

## 2023-02-10 VITALS — BP 121/81 | HR 71

## 2023-02-10 DIAGNOSIS — Z7189 Other specified counseling: Secondary | ICD-10-CM

## 2023-02-10 DIAGNOSIS — L80 Vitiligo: Secondary | ICD-10-CM

## 2023-02-10 DIAGNOSIS — L650 Telogen effluvium: Secondary | ICD-10-CM | POA: Diagnosis not present

## 2023-02-10 DIAGNOSIS — L219 Seborrheic dermatitis, unspecified: Secondary | ICD-10-CM | POA: Diagnosis not present

## 2023-02-10 DIAGNOSIS — Z79899 Other long term (current) drug therapy: Secondary | ICD-10-CM

## 2023-02-10 MED ORDER — TACROLIMUS 0.1 % EX OINT: TOPICAL_OINTMENT | CUTANEOUS | 3 refills | Status: DC

## 2023-02-10 MED ORDER — MINOXIDIL 2.5 MG PO TABS: 2.5000 mg | ORAL_TABLET | Freq: Every day | ORAL | 6 refills | Status: AC

## 2023-02-10 MED ORDER — KETOCONAZOLE 2 % EX SHAM: MEDICATED_SHAMPOO | CUTANEOUS | 11 refills | Status: DC

## 2023-02-10 MED ORDER — MOMETASONE FUROATE 0.1 % EX CREA: 1.0000 | TOPICAL_CREAM | Freq: Every day | CUTANEOUS | 2 refills | Status: DC

## 2023-02-10 MED ORDER — MOMETASONE FUROATE 0.1 % EX SOLN: CUTANEOUS | 1 refills | Status: DC

## 2023-02-10 NOTE — Patient Instructions (Addendum)
For Vitiligo  Continue mometasone cream apply to any itchy areas daily 5 x weekly.  Continue tacrolimus ointment apply topically to affected areas of body for vitiligo daily  Topical steroids (such as triamcinolone, fluocinolone, fluocinonide, mometasone, clobetasol, halobetasol, betamethasone, hydrocortisone) can cause thinning and lightening of the skin if they are used for too long in the same area. Your physician has selected the right strength medicine for your problem and area affected on the body. Please use your medication only as directed by your physician to prevent side effects.     Telogen Effluvium Counseling Telogen effluvium is a benign, self-limited condition causing increased hair shedding usually for several months. It does not progress to baldness, and the hair eventually grows back on its own. It can be triggered by recent illness, recent surgery, thyroid disease, low iron stores, vitamin D deficiency, fad diets or rapid weight loss, hormonal changes such as pregnancy or birth control pills, and some medication. Usually the hair loss starts 2-3 months after the illness or health change. Rarely, it can continue for longer than a year. Treatments options may include oral or topical Minoxidil; Red Light scalp treatments; Biotin 2.5 mg daily and other options.    Due to recent changes in healthcare laws, you may see results of your pathology and/or laboratory studies on MyChart before the doctors have had a chance to review them. We understand that in some cases there may be results that are confusing or concerning to you. Please understand that not all results are received at the same time and often the doctors may need to interpret multiple results in order to provide you with the best plan of care or course of treatment. Therefore, we ask that you please give [place] 2 business days to thoroughly review all your results before contacting the office for clarification. Should we see a  critical lab result, you will be contacted sooner.   If You Need Anything After Your Visit  If you have any questions or concerns for your doctor, please call our main line at [phone] and press option 4 to reach your doctor's medical assistant. If no one answers, please leave a voicemail as directed and we will return your call as soon as possible. Messages left after 4 pm will be answered the following business day.   You may also send [place] a message via [place]. We typically respond to MyChart messages within 1-2 business days.  For prescription refills, please ask your pharmacy to contact our office. Our fax number is [phone].  If you have an urgent issue when the clinic is closed that cannot wait until the next business day, you can page your doctor at the number below.    Please note that while we do our best to be available for urgent issues outside of office hours, we are not available 24/7.   If you have an urgent issue and are unable to reach [place], you may choose to seek medical care at your doctor's office, retail clinic, urgent care center, or emergency room.  If you have a medical emergency, please immediately call 911 or go to the emergency department.  Pager Numbers  - Dr. Nehemiah Massed: [phone]  - Dr. Laurence Ferrari: [phone]  - Dr. Nicole Kindred: [phone]  In the event of inclement weather, please call our main line at [phone] for an update on the status of any delays or closures.  Dermatology Medication Tips: Please keep the boxes that topical medications come in in order to help keep  track of the instructions about where and how to use these. Pharmacies typically print the medication instructions only on the boxes and not directly on the medication tubes.   If your medication is too expensive, please contact our office at [phone] option 4 or send [place] a message through [place].   We are unable to tell what your co-pay for medications will be in advance as  this is different depending on your insurance coverage. However, we may be able to find a substitute medication at lower cost or fill out paperwork to get insurance to cover a needed medication.   If a prior authorization is required to get your medication covered by your insurance company, please allow [place] 1-2 business days to complete this process.  Drug prices often vary depending on where the prescription is filled and some pharmacies may offer cheaper prices.  The website www.goodrx.com contains coupons for medications through different pharmacies. The prices here do not account for what the cost may be with help from insurance (it may be cheaper with your insurance), but the website can give you the price if you did not use any insurance.  - You can print the associated coupon and take it with your prescription to the pharmacy.  - You may also stop by our office during regular business hours and pick up a GoodRx coupon card.  - If you need your prescription sent electronically to a different pharmacy, notify our office through [facility] or by phone at [phone] option 4.     Si Usted Necesita Algo Despus de Su Visita  Tambin puede enviarnos un mensaje a travs de Pharmacist, community. Por lo general respondemos a los mensajes de MyChart en el transcurso de 1 a 2 das hbiles.  Para renovar recetas, por favor pida a su farmacia que se ponga en contacto con nuestra oficina. Harland Dingwall de fax es [place] [phone].  Si tiene un asunto urgente cuando la clnica est cerrada y que no puede esperar hasta el siguiente da hbil, puede llamar/localizar a su doctor(a) al nmero que aparece a continuacin.   Por favor, tenga en cuenta que aunque hacemos todo lo posible para estar disponibles para asuntos urgentes fuera del horario de [place], no estamos disponibles las 24 horas del da, los 7 das de la [place].   Si tiene un problema urgente y no puede comunicarse con nosotros, puede optar por  buscar atencin mdica  en el consultorio de su doctor(a), en una clnica privada, en un centro de atencin urgente o en una sala de emergencias.  Si tiene Engineering geologist, por favor llame inmediatamente al 911 o vaya a la sala de emergencias.  Nmeros de bper  - Dr. Nehemiah Massed: [phone]  - Dra. Moye: [phone]  - Dra. Nicole Kindred: [phone]  En caso de inclemencias del [place], por favor llame a Johnsie Kindred principal al [phone] para una actualizacin sobre el [place] de cualquier retraso o cierre.  Consejos para la medicacin en dermatologa: Por favor, guarde las cajas en las que vienen los medicamentos de uso tpico para ayudarle a seguir las instrucciones sobre dnde y cmo usarlos. Las farmacias generalmente imprimen las instrucciones del medicamento slo en las cajas y no directamente en los tubos del [place].   Si su medicamento es muy caro, por favor, pngase en contacto con Zigmund Daniel llamando al [phone] y presione la opcin 4 o envenos un mensaje a travs de Pharmacist, community.   No podemos decirle cul ser su copago por los medicamentos  por adelantado ya que esto es diferente dependiendo de la cobertura de su seguro. Sin embargo, es posible que podamos encontrar un medicamento sustituto a Electrical engineer un formulario para que el seguro cubra el medicamento que se considera necesario.   Si se requiere una autorizacin previa para que su compaa de seguros [place] su medicamento, por favor permtanos de 1 a 2 das hbiles para completar este proceso.  Los precios de los medicamentos varan con frecuencia dependiendo del Environmental consultant de dnde se surte la receta y alguna farmacias pueden ofrecer precios ms baratos.  El sitio web www.goodrx.com tiene cupones para medicamentos de Airline pilot. Los precios aqu no tienen en cuenta lo que podra costar con la ayuda del seguro (puede ser ms barato con su seguro), pero el sitio web puede darle el precio si no  utiliz Research scientist (physical sciences).  - Puede imprimir el cupn correspondiente y llevarlo con su receta a la farmacia.  - Tambin puede pasar por nuestra oficina durante el horario de atencin regular y Charity fundraiser una tarjeta de cupones de GoodRx.  - Si necesita que su receta se enve electrnicamente a una farmacia diferente, informe a nuestra oficina a travs de MyChart de [place] o por telfono llamando al [phone] y presione la opcin 4.

## 2023-02-10 NOTE — Progress Notes (Signed)
Follow-Up Visit   Subjective  Whitney [name] is a 47 y.o. female who presents for the following: 6 week follow up (Patient is here for 6 week follow up. Spoke with patient with interpretor service over the phone today. She reports she is currently being treated with light box therapy and it has helped with affected areas. She has been using mometasone cream and tacrolimus ointment and has helped but needs refills. Reports to interpretor pharmacy would not give patient refills. ) and Alopecia (Patient is also here today concerning hair loss and itchy scalp. She would like to discuss treatment options. ). Feels like she is itchy at scalp Visit was performed through interpreter services  The following portions of the chart were reviewed this encounter and updated as appropriate:  Tobacco  Allergies  Meds  Problems  Med Hx  Surg Hx  Fam Hx     Review of Systems: No other skin or systemic complaints except as noted in HPI or Assessment and Plan.  Objective  Well appearing patient in no apparent distress; mood and affect are within normal limits.  A focused examination was performed including face, neck, chest and back. Relevant physical exam findings are noted in the Assessment and Plan.  hands, wrist, back Depigmented patches  Scalp Diffuse thinning of hair, positive hair pull test.              Assessment & Plan  Vitiligo Severe but improving with treatment Counseling through interpreter today hands, wrist, back See previous photos from last visit Vitiligo is a chronic autoimmune condition which causes loss of skin pigment and is commonly seen on the face and may also involve areas of trauma like hands, elbows, knees, and ankles. There is no cure and it is difficult to treat.  Treatments include topical steroids and other topical anti-inflammatory ointments/creams and topical and oral Jak inhibitors.  Sometimes narrow band UV light therapy or Xtrac laser is helpful,  both of which require twice weekly treatments for at least 3-6 months.  Antioxidant vitamins, such as Vitamins A,C,E,D, Folic Acid and B12 may be added to enhance treatment.  Continue NBUVB treatments 2 times per week. Continue tacrolimus 0.1 % ointment apply qd to hands, wrists and neck,  Continue Mometasone cream qd up to 5 days per week to itchy areas Rx sent to [facility]. Patient stated [facility] would not give patient refills. Patient instructed to call if she needed  tacrolimus (PROTOPIC) 0.1 % ointment - hands, wrist, back Apply to Hands, wrist and neck daily mometasone (ELOCON) 0.1 % cream - hands, wrist, back Apply 1 Application topically daily. 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 It does not progress to baldness, and the hair eventually grows back on its own. It can be triggered by recent illness, recent surgery, thyroid disease, low iron stores, vitamin D deficiency, fad diets or rapid weight loss, hormonal changes such as pregnancy or birth control pills, and some medication. Usually the hair loss starts 2-3 months after the illness or health change. Rarely, it can continue for longer than a year. Treatments options may include oral or topical Minoxidil; Red Light scalp treatments; Biotin 2.5 mg daily and other options.  BP121/81  Start Minoxidil 2.5 mg tablet - take 1/2 tab by mouth daily. Patient instructed to take 1/2 tablet  but would send refill as 1 whole tablet. Patient with interpretor verbalized she understood.  Doses of minoxidil for hair loss are considered 'low dose'. This is because the doses used for hair loss are much lower than the doses which are used for conditions such as high blood pressure (hypertension). The doses used for  hypertension are 10-'40mg'$  per day.  Side effects are uncommon at the low doses (up to 2.5 mg/day) used to treat hair loss. Potential side effects, more commonly seen at higher doses, include: Increase in hair growth (hypertrichosis) elsewhere on face and body Temporary hair shedding upon starting medication which may last up to 4 weeks Ankle swelling, fluid retention, rapid weight gain more than 5 pounds Low blood pressure and feeling lightheaded or dizzy when standing up quickly Fast or irregular heartbeat Headaches  Minoxidil 2.5 half a pill po qd minoxidil (LONITEN) 2.5 MG tablet - Scalp Take 1 tablet (2.5 mg total) by mouth daily.  Seborrheic dermatitis with pruritus Scalp Seborrheic Dermatitis  -  is a chronic persistent rash characterized by pinkness and scaling most commonly of the mid face but also can occur on the scalp (dandruff), ears; mid chest, mid back and groin.  It tends to be exacerbated by stress and cooler weather.  People who have neurologic disease may experience new onset or exacerbation of existing seborrheic dermatitis.  The condition is not curable but treatable and can be controlled. Start ketoconazole shampoo - apply three times per week, massage into scalp and leave in for 5 -10 minutes before rinsing out  Start mometasone lotion 0.1 % apply topically to itchy areas of scalp 3 to 4 days weekly prn.  Topical steroids (such as triamcinolone, fluocinolone, fluocinonide, mometasone, clobetasol, halobetasol, betamethasone, hydrocortisone) can cause thinning and lightening of the skin if they are used for too long in the same area. Your physician has selected the right strength medicine for your problem and area affected on the body. Please use your medication only as directed by your physician to prevent side effects.   ketoconazole (NIZORAL) 2 % shampoo - Scalp apply three times per week, massage into scalp and leave in for 5 -  10 minutes before rinsing out  mometasone  (ELOCON) 0.1 % lotion - Scalp Apply topically to itchy areas of scalp 3 to 4 days weekly prn  Spent additional 45 minutes with patient and interpretor over the phone dictating care.  Return as scheduled for follow-up  I, Ruthell Rummage, CMA, am acting as scribe for Sarina Ser, MD. Documentation: I have reviewed the above documentation for accuracy and completeness, and I agree with the above.  Sarina Ser, MD

## 2023-02-14 IMAGING — US US PELVIS COMPLETE WITH TRANSVAGINAL
1 series · 14 of 25 positions shown · non-contrast
Comparison: None

CLINICAL DATA: Pelvic pain

EXAM:
TRANSABDOMINAL AND TRANSVAGINAL ULTRASOUND OF PELVIS
TECHNIQUE: Both transabdominal and transvaginal ultrasound examinations of the
pelvis were performed. Transabdominal technique was performed for
global imaging of the pelvis including uterus, ovaries, adnexal
regions, and pelvic cul-de-sac. It was necessary to proceed with
endovaginal exam following the transabdominal exam to visualize the
uterus endometrium ovaries.

[Series 1: us pelvis complete with transvaginal · 0.18mm/px · 14 of 86 slices shown]
[im 1/86]
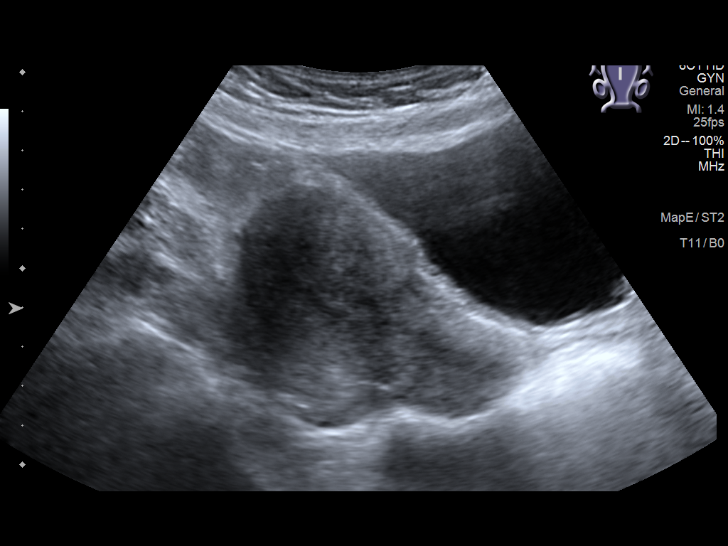
[im 8/86]
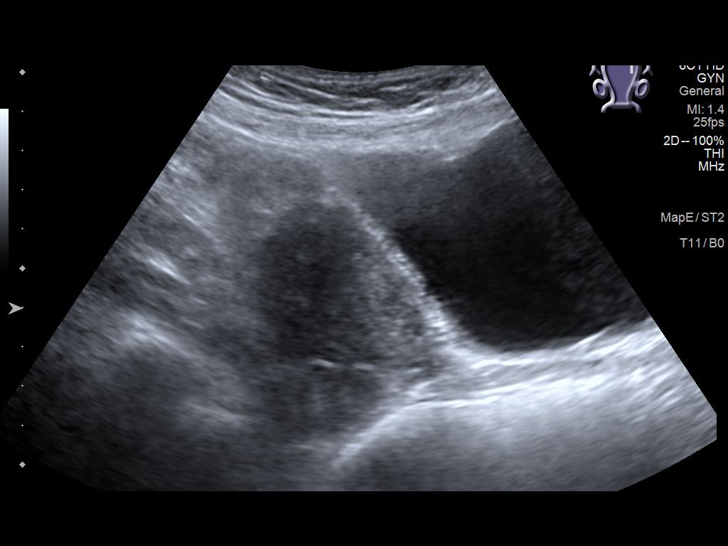
[im 15/86]
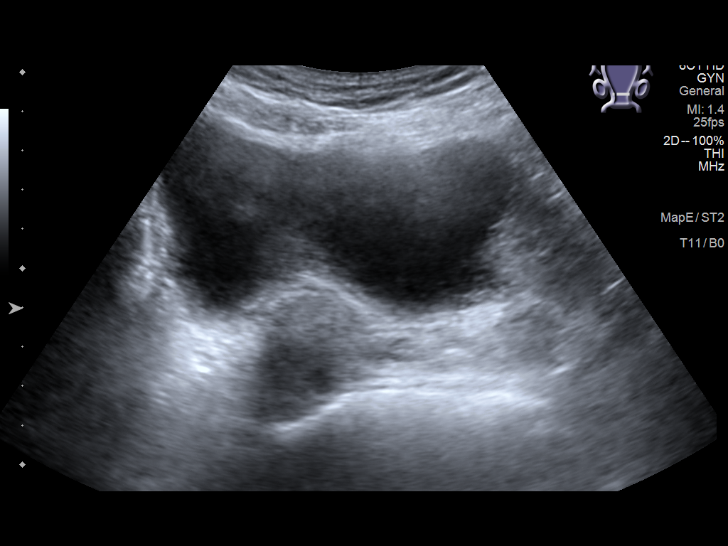
[im 22/86]
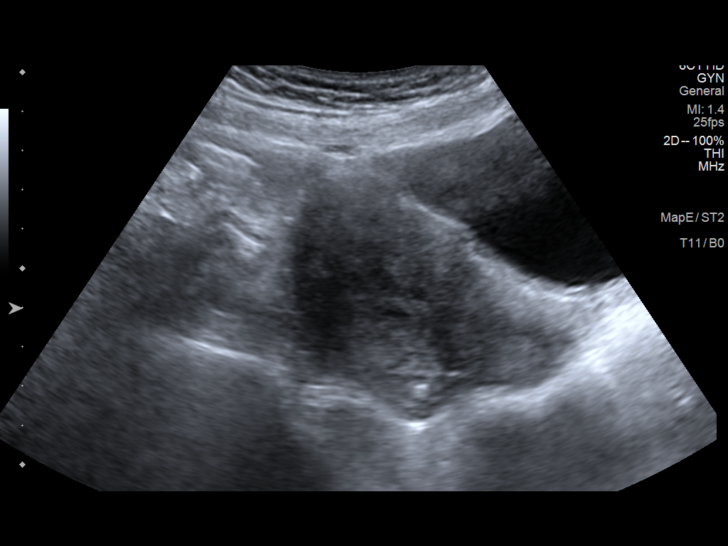
[im 29/86]
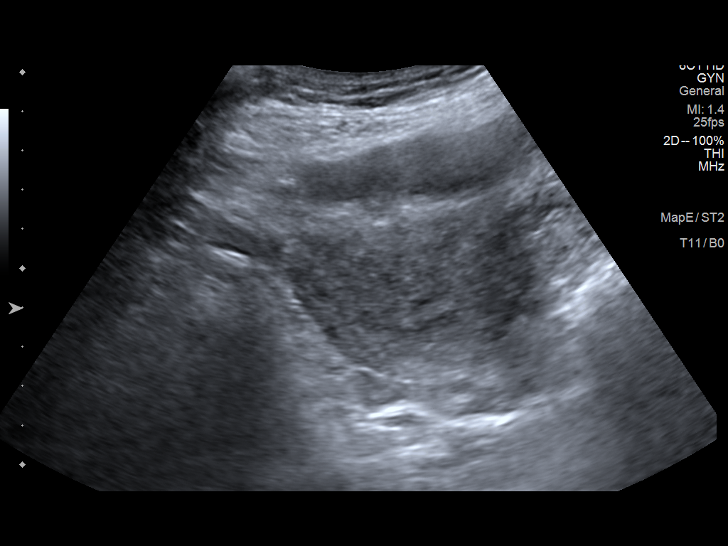
[im 32/86]
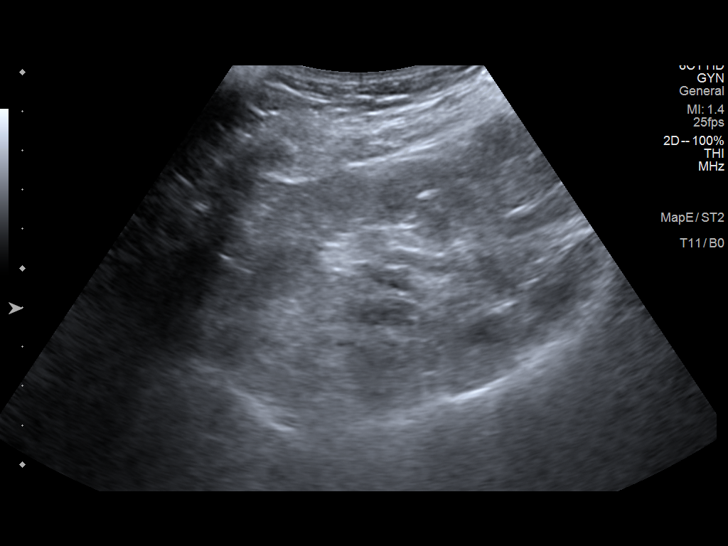
[im 39/86]
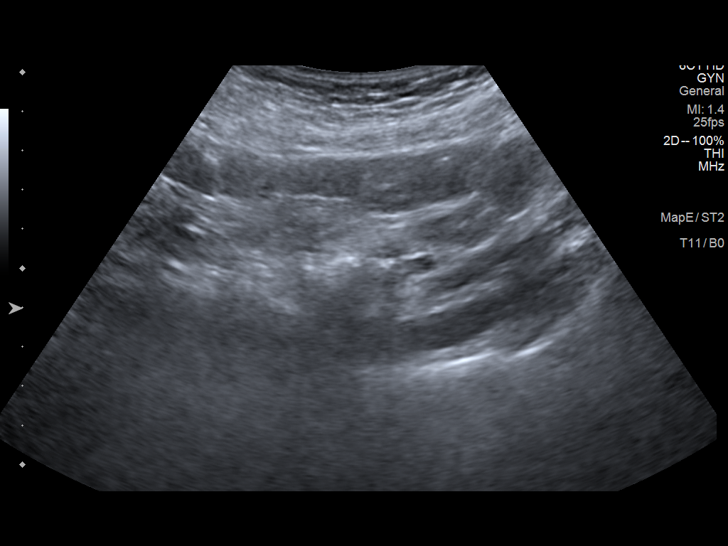
[im 47/86]
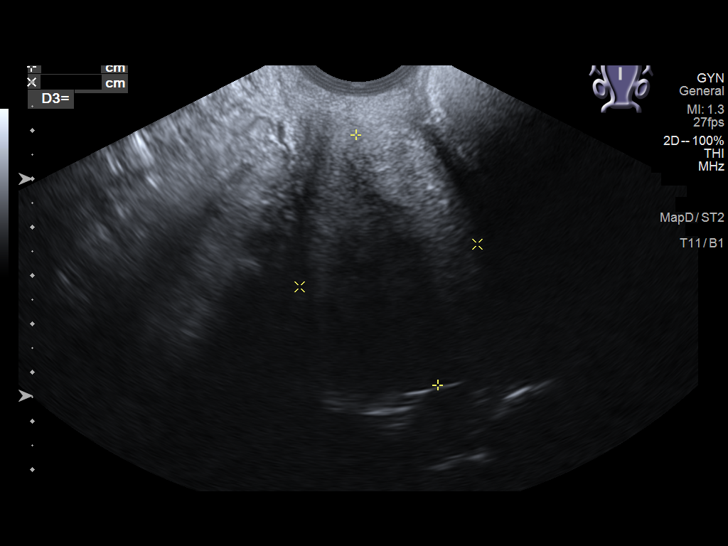
[im 54/86]
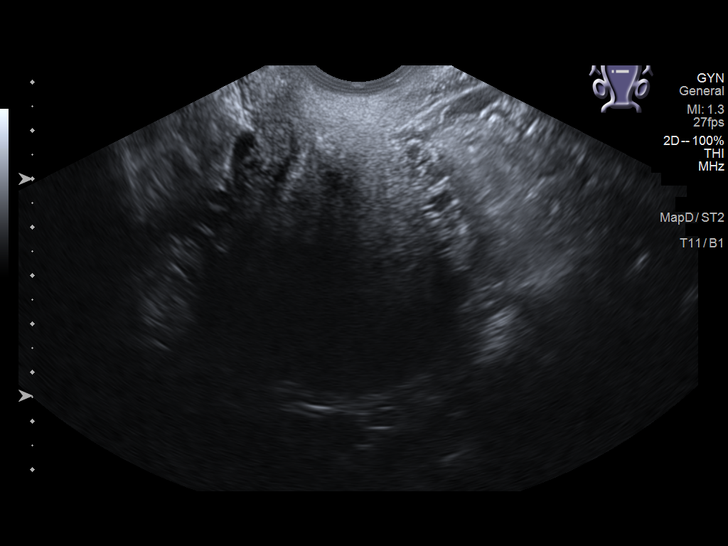
[im 57/86]
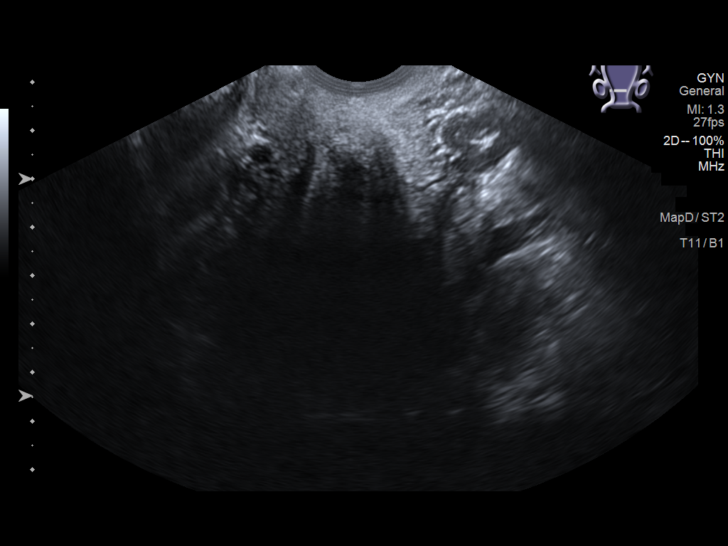
[im 64/86]
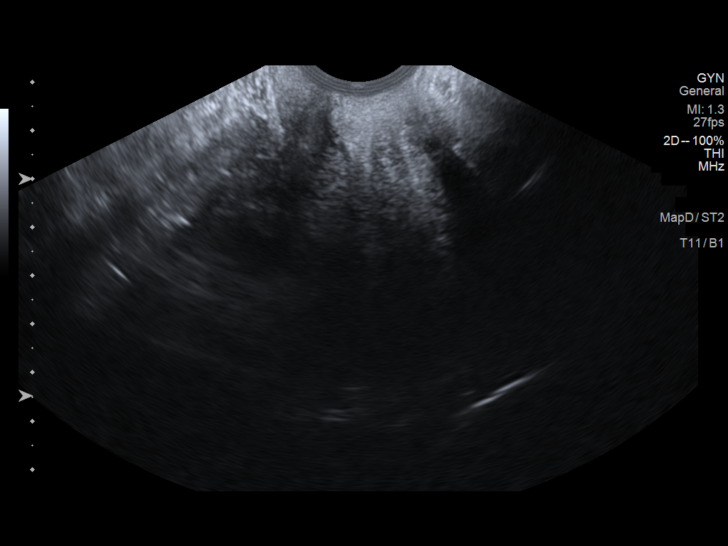
[im 71/86]
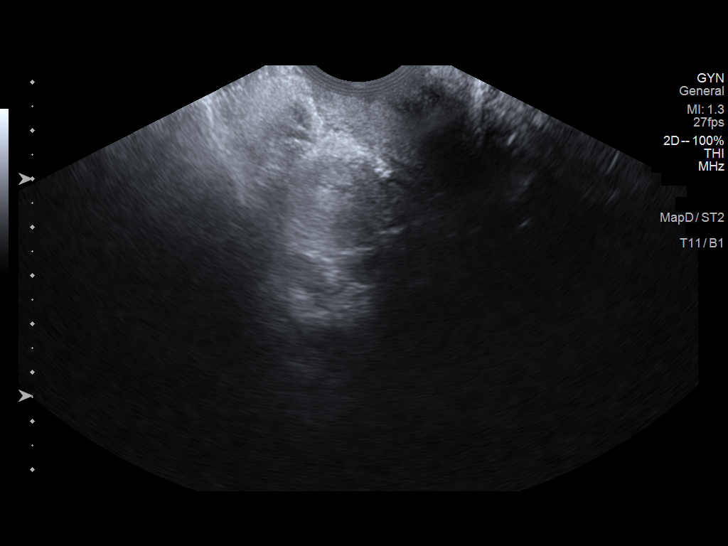
[im 78/86]
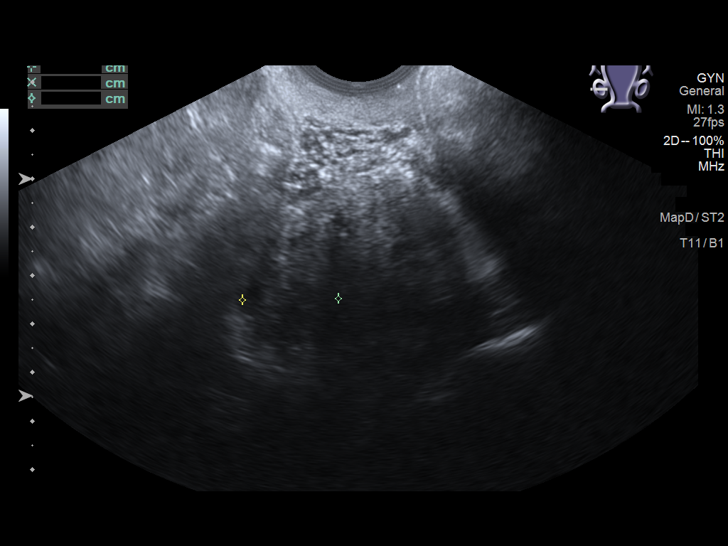
[im 86/86]
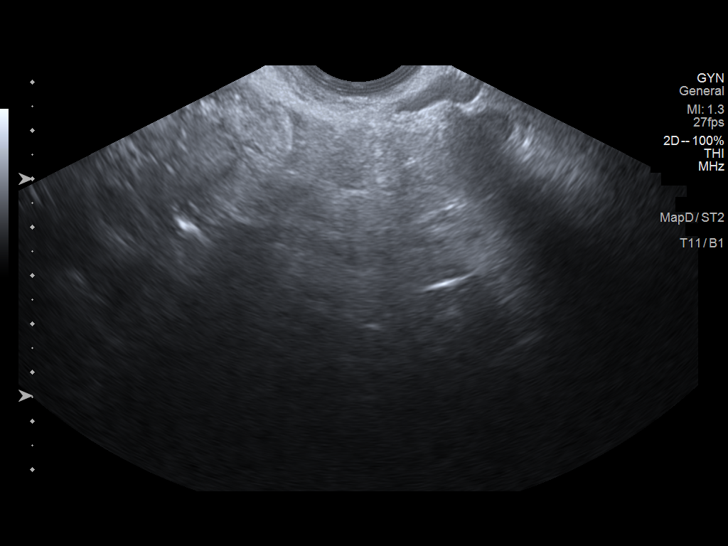

[14 of 25 positions shown; findings below may reference images not displayed]

FINDINGS: Uterus

Measurements: 6.5 x 4.1 x 5.4 cm = volume: 75.5 mL. No fibroids or
other mass visualized.

Endometrium

Thickness: 6.3 mm.  No focal abnormality visualized.

Right ovary

Measurements: 2.9 by 2.2 x 2 cm = volume: 6.7 mL. Normal
appearance/no adnexal mass.

Left ovary

Not seen

Other findings

No abnormal free fluid.
IMPRESSION: Nonvisualized left ovary.  Otherwise negative pelvic ultrasound.

## 2023-02-15 ENCOUNTER — Ambulatory Visit (INDEPENDENT_AMBULATORY_CARE_PROVIDER_SITE_OTHER): Payer: Medicaid Other

## 2023-02-15 DIAGNOSIS — L80 Vitiligo: Secondary | ICD-10-CM

## 2023-02-15 NOTE — Progress Notes (Signed)
Patient here today for lightobox treatment for Vitiligo.   Patient treated front of body for 3 minutes and 48 seconds and back of body 3 minutes 48 seconds.      Johnsie Kindred, RMA

## 2023-02-17 ENCOUNTER — Ambulatory Visit (INDEPENDENT_AMBULATORY_CARE_PROVIDER_SITE_OTHER): Payer: Medicaid Other

## 2023-02-17 ENCOUNTER — Encounter: Payer: Self-pay | Admitting: Dermatology

## 2023-02-17 DIAGNOSIS — L80 Vitiligo: Secondary | ICD-10-CM

## 2023-02-17 NOTE — Progress Notes (Signed)
Patient here today for lightobox treatment for Vitiligo.   Patient treated front of body for 3 minutes and 58 seconds and back of body 3 minutes 58 seconds.      Johnsie Kindred, RMA

## 2023-02-22 ENCOUNTER — Ambulatory Visit (INDEPENDENT_AMBULATORY_CARE_PROVIDER_SITE_OTHER): Payer: Medicaid Other

## 2023-02-22 DIAGNOSIS — L80 Vitiligo: Secondary | ICD-10-CM

## 2023-02-22 NOTE — Progress Notes (Signed)
Patient here today for lightobox treatment for Vitiligo.   Patient treated front of body for 4 minutes and 8 seconds and back of body 4 minutes 8 seconds.      Johnsie Kindred, RMA

## 2023-02-25 ENCOUNTER — Ambulatory Visit (INDEPENDENT_AMBULATORY_CARE_PROVIDER_SITE_OTHER): Payer: Medicaid Other

## 2023-02-25 DIAGNOSIS — L80 Vitiligo: Secondary | ICD-10-CM

## 2023-03-01 ENCOUNTER — Ambulatory Visit (INDEPENDENT_AMBULATORY_CARE_PROVIDER_SITE_OTHER): Payer: Medicaid Other

## 2023-03-01 DIAGNOSIS — L80 Vitiligo: Secondary | ICD-10-CM | POA: Diagnosis not present

## 2023-03-01 NOTE — Progress Notes (Unsigned)
Patient here today for lightbox treatment for Vitiligo.   Patient treated front of body for 4 minutes and 28 seconds and back of body 4 minutes 28 seconds.     Dicie Beam, RMA

## 2023-03-02 NOTE — Progress Notes (Signed)
Patient here today for lightobox treatment for Vitiligo.   Patient treated front of body for 4 minutes and 18 seconds and back of body 4 minutes 18 seconds.      Johnsie Kindred, RMA

## 2023-03-04 ENCOUNTER — Ambulatory Visit (INDEPENDENT_AMBULATORY_CARE_PROVIDER_SITE_OTHER): Payer: Medicaid Other

## 2023-03-04 DIAGNOSIS — L80 Vitiligo: Secondary | ICD-10-CM | POA: Diagnosis not present

## 2023-03-04 NOTE — Progress Notes (Signed)
Patient here today for lightbox treatment for Vitiligo.   Patient treated front of body for 4 minutes and 38 seconds and back of body 4 minutes 38 seconds.      Dicie Beam, RMA

## 2023-03-08 ENCOUNTER — Ambulatory Visit (INDEPENDENT_AMBULATORY_CARE_PROVIDER_SITE_OTHER): Payer: Medicaid Other

## 2023-03-08 DIAGNOSIS — L80 Vitiligo: Secondary | ICD-10-CM | POA: Diagnosis not present

## 2023-03-08 NOTE — Progress Notes (Signed)
Patient here today for lightbox treatment for Vitiligo.   Patient treated front of body for 4 minutes and 48 seconds and back of body 4 minutes 48 seconds.      Dicie Beam, RMA

## 2023-03-11 ENCOUNTER — Ambulatory Visit (INDEPENDENT_AMBULATORY_CARE_PROVIDER_SITE_OTHER): Payer: Medicaid Other

## 2023-03-11 DIAGNOSIS — L80 Vitiligo: Secondary | ICD-10-CM

## 2023-03-11 NOTE — Progress Notes (Signed)
Patient here today for lightbox treatment for Vitiligo.   Patient treated front of body for 4 minutes and 58 seconds and back of body 4 minutes 58 seconds.      Dicie Beam, RMA

## 2023-03-16 ENCOUNTER — Ambulatory Visit (INDEPENDENT_AMBULATORY_CARE_PROVIDER_SITE_OTHER): Payer: Medicaid Other

## 2023-03-16 DIAGNOSIS — L80 Vitiligo: Secondary | ICD-10-CM | POA: Diagnosis not present

## 2023-03-16 NOTE — Progress Notes (Signed)
Patient here today for lightbox treatment for Vitiligo.   Patient treated front of body for 5 minutes and 8 seconds and back of body 5 minutes 8 seconds.      Johnsie Kindred, RMA

## 2023-03-18 ENCOUNTER — Ambulatory Visit (INDEPENDENT_AMBULATORY_CARE_PROVIDER_SITE_OTHER): Payer: Medicaid Other

## 2023-03-18 DIAGNOSIS — L80 Vitiligo: Secondary | ICD-10-CM

## 2023-03-18 NOTE — Progress Notes (Signed)
Patient here today for lightbox treatment for Vitiligo.   Patient treated front of body for 5 minutes and 18 seconds and back of body 5 minutes 18 seconds.      Johnsie Kindred, RMA

## 2023-03-22 ENCOUNTER — Ambulatory Visit (INDEPENDENT_AMBULATORY_CARE_PROVIDER_SITE_OTHER): Payer: Medicaid Other

## 2023-03-22 DIAGNOSIS — L80 Vitiligo: Secondary | ICD-10-CM | POA: Diagnosis not present

## 2023-03-22 NOTE — Progress Notes (Signed)
Patient here today for lightbox treatment for Vitiligo.   Patient treated front of body for 5 minutes and 28 seconds and back of body 5 minutes 28 seconds.      Evorn Gong, RMA

## 2023-03-25 ENCOUNTER — Ambulatory Visit (INDEPENDENT_AMBULATORY_CARE_PROVIDER_SITE_OTHER): Payer: Medicaid Other

## 2023-03-25 DIAGNOSIS — L80 Vitiligo: Secondary | ICD-10-CM

## 2023-03-25 NOTE — Progress Notes (Signed)
Patient here today for lightbox treatment for Vitiligo.   Patient treated front of body for 5 minutes and 38 seconds and back of body 5 minutes 38 seconds.      Dorathy Daft, RMA

## 2023-03-29 ENCOUNTER — Ambulatory Visit (INDEPENDENT_AMBULATORY_CARE_PROVIDER_SITE_OTHER): Payer: Medicaid Other

## 2023-03-29 DIAGNOSIS — L80 Vitiligo: Secondary | ICD-10-CM | POA: Diagnosis not present

## 2023-03-29 NOTE — Progress Notes (Signed)
Patient here today for lightbox treatment for Vitiligo.   Patient treated front of body for 5 minutes and 48 seconds and back of body 5 minutes 48 seconds.      Dorathy Daft, RMA

## 2023-04-01 ENCOUNTER — Ambulatory Visit (INDEPENDENT_AMBULATORY_CARE_PROVIDER_SITE_OTHER): Payer: Medicaid Other

## 2023-04-01 DIAGNOSIS — L80 Vitiligo: Secondary | ICD-10-CM

## 2023-04-01 NOTE — Progress Notes (Signed)
Patient here today for lightbox treatment for Vitiligo.   Patient treated front of body for 5 minutes and 58 seconds and back of body 5 minutes 58 seconds.      Dorathy Daft, RMA

## 2023-04-05 ENCOUNTER — Ambulatory Visit (INDEPENDENT_AMBULATORY_CARE_PROVIDER_SITE_OTHER): Payer: Medicaid Other

## 2023-04-05 DIAGNOSIS — L80 Vitiligo: Secondary | ICD-10-CM

## 2023-04-05 NOTE — Progress Notes (Signed)
Patient here today for lightbox treatment for Vitiligo.   Patient treated front of body for 6 minutes and 08 seconds and back of body 6 minutes 08 seconds.      Evorn Gong RMA

## 2023-04-08 ENCOUNTER — Ambulatory Visit (INDEPENDENT_AMBULATORY_CARE_PROVIDER_SITE_OTHER): Payer: Medicaid Other

## 2023-04-08 DIAGNOSIS — L80 Vitiligo: Secondary | ICD-10-CM | POA: Diagnosis not present

## 2023-04-08 NOTE — Progress Notes (Signed)
Patient here today for lightbox treatment for Vitiligo.   Patient treated front of body for 6 minutes and 18 seconds and back of body 6 minutes 18 seconds.      Dorathy Daft, RMA

## 2023-04-12 ENCOUNTER — Ambulatory Visit (INDEPENDENT_AMBULATORY_CARE_PROVIDER_SITE_OTHER): Payer: Medicaid Other

## 2023-04-12 DIAGNOSIS — L80 Vitiligo: Secondary | ICD-10-CM

## 2023-04-12 NOTE — Progress Notes (Signed)
Patient here today for lightbox treatment for Vitiligo.   Patient treated front of body for 6 minutes and 28 seconds and back of body 6 minutes 28 seconds.      Evorn Gong, RMA

## 2023-04-14 ENCOUNTER — Ambulatory Visit: Payer: Medicaid Other | Admitting: Dermatology

## 2023-04-14 ENCOUNTER — Ambulatory Visit: Payer: Medicaid Other

## 2023-05-26 ENCOUNTER — Ambulatory Visit: Payer: Self-pay | Admitting: Dermatology

## 2023-06-08 ENCOUNTER — Ambulatory Visit (INDEPENDENT_AMBULATORY_CARE_PROVIDER_SITE_OTHER): Payer: Medicaid Other | Admitting: Dermatology

## 2023-06-08 VITALS — BP 132/89 | HR 74

## 2023-06-08 DIAGNOSIS — L299 Pruritus, unspecified: Secondary | ICD-10-CM

## 2023-06-08 DIAGNOSIS — L219 Seborrheic dermatitis, unspecified: Secondary | ICD-10-CM | POA: Diagnosis not present

## 2023-06-08 DIAGNOSIS — L650 Telogen effluvium: Secondary | ICD-10-CM | POA: Diagnosis not present

## 2023-06-08 DIAGNOSIS — L80 Vitiligo: Secondary | ICD-10-CM

## 2023-06-08 DIAGNOSIS — Z79899 Other long term (current) drug therapy: Secondary | ICD-10-CM

## 2023-06-08 DIAGNOSIS — Z7189 Other specified counseling: Secondary | ICD-10-CM

## 2023-06-08 NOTE — Progress Notes (Unsigned)
Follow-Up Visit   Subjective  Whitney [name] is a 47 y.o. female who presents for the following: vitiligo - condition has improved with NBUVB. 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 She would like to know if there are any stronger treatment options than what she is using.   Interpreter is with patient and contributes to history interpreting the whole visit.  The following portions of the chart were reviewed this encounter and updated as appropriate: medications, allergies, medical history  Review of Systems:  No other skin or systemic complaints except as noted in HPI or Assessment and Plan.  Objective  Well appearing patient in no apparent distress; mood and affect are within normal limits. A focused examination was performed of the following areas: the face Relevant exam findings are noted in the Assessment and Plan.   Assessment & Plan   VITILIGO Exam: depigmented patches on hands, wrist, back, chest Chronic and persistent condition with duration or expected duration over one year. Condition is symptomatic / bothersome to patient. Not to goal, but improving.  Vitiligo is a chronic autoimmune condition which causes loss of skin pigment and is commonly seen on the face and may also involve areas of trauma like hands, elbows, knees, and ankles. There is no cure and it is difficult to treat.  Treatments include topical steroids and other topical anti-inflammatory ointments/creams and topical and oral Jak inhibitors.  Sometimes narrow band UV light therapy or Xtrac laser is helpful, both of which require twice weekly treatments for at least 3-6 months.  Antioxidant vitamins, such as Vitamins A,C,E,D,  Folic Acid and B12 may be added to enhance treatment. Heliocare may also enhance treatment results.  Treatment Plan: Patient would like to restart NBUVB for vitiligo as she has found it helpful. Restart NBUVB at beginning time at not at 6 minutes and 28 seconds.  Continue Tacrolimus 0.1 % ointment apply qd to hands, wrists and neck 7d/wk. Continue Mometasone cream qd up to 5 days per week to itchy areas.  Patient's insurance will not pay for topical Opzelura treatment  SEBORRHEIC DERMATITIS with pruritus  Exam: Pink patches with greasy scale at scalp   Chronic and persistent condition with duration or expected duration over one year. Condition is symptomatic/ bothersome to patient. Not currently at goal.  Improving  Seborrheic Dermatitis is a chronic persistent rash characterized by pinkness and scaling most commonly of the mid face but also can occur on the scalp (dandruff), ears; mid chest, mid back and groin.  It tends to be exacerbated by stress and cooler weather.  People who have neurologic disease may experience new onset or exacerbation of existing seborrheic dermatitis.  The condition is not curable but treatable and can be controlled.  Treatment Plan: Continue Ketoconazole 2% shampoo let sit 5-10 minutes then wash out. Use 3 days per week.  Continue Mometasone 0.1% lotion apply to itchy areas of scalp 3 - 4 days per week. Counseling and coordination of care  Seborrheic dermatitis  Related Medications ketoconazole (NIZORAL) 2 % shampoo apply three times per week, massage into scalp and leave in for 5 -  10 minutes before rinsing out  mometasone (ELOCON) 0.1 % lotion Apply topically to  itchy areas of scalp 3 to 4 days weekly prn  Telogen effluvium  Related Medications minoxidil (LONITEN) 2.5 MG tablet Take 1 tablet (2.5 mg total) by mouth daily.  Vitiligo  Related Medications tacrolimus (PROTOPIC) 0.1 % ointment Apply to Hands, wrist and neck daily  mometasone  (ELOCON) 0.1 % cream Apply 1 Application topically daily. Apply topically to affected itchy areas daily 5 x weekly  Medication management  Pruritus   Pruritis itching may be secondary to histamine release/hives.  For Hives (urticaria); dermatographism or Itch: Start non sedating antihistamine (either Allegra 180mg , or Claritin 10mg , or Zyrtec 10mg ) daily.  All these are non-prescription ("Over the Counter").   Start out with 1 pill a day.   After a week if not improving may increase to 2 pills a day.   After another week if not improving may increase to 3 pills a day.   After another week if still not improving may take up to 4 pills a day. Stay at highest dose that keeps condition controlled, but only up to 4 pills a day. Stay at the controlling dose for at least 2 weeks. Contact office if taking 4 pills of antihistamine a day for at least 2 weeks without control of condition as other options may be available.  Telogen effluvium Chronic and persistent condition with duration or expected duration over one year. Condition is symptomatic / bothersome to patient. Not to goal, but much improved and continues to get better.  Patient is pleased.  Telogen Effluvium Counseling Telogen effluvium is a benign, self-limited condition causing increased hair shedding usually for several months. It does not progress to baldness, and the hair eventually grows back on its own. It can be triggered by recent illness, recent surgery, thyroid disease, low iron stores, vitamin D deficiency, fad diets or rapid weight loss, hormonal changes such as pregnancy or birth control pills, and some medication. Usually the hair loss starts 2-3 months after the illness or health change. Rarely, it can continue for longer than a year. Treatments options may include oral or topical Minoxidil; Red Light scalp treatments; Biotin 2.5 mg daily and other options.  Continue biotin Continue oral minoxidil 2.5 mg  take half a pill a  day.  No side effects and no ankle swelling  Long term medication management.  Patient is using long term (months to years) prescription medication  to control their dermatologic condition.  These medications require periodic monitoring to evaluate for efficacy and side effects and may require periodic laboratory monitoring.  Return in about 4 months (around 10/08/2023) for for follow up, NBUVB once approved by insurance.  Maylene Roes, CMA, am acting as scribe for Armida Sans, MD .  Documentation: I have reviewed the above documentation for accuracy and completeness, and I agree with the above.  Armida Sans, MD

## 2023-06-08 NOTE — Patient Instructions (Addendum)
Continue Tacrolimus 0.1 % ointment apply once daily to hands, wrists and neck 7 days per week.   Continue Mometasone cream once daily up to 5 days per week to itchy areas on the body.   Continue Mometasone 0.1% solution to affected areas of the scalp once daily up to 5 days per week.  Continue Ketoconazole 2% shampoo let sit 5-10 minutes then wash out. Use 3 days per week.   Start over the counter Biotin 2.5 mg once daily for hair loss.   Continue Minoxidil 2.5mg  take 1/2 tablet by mouth once daily for hair loss.   Pruritis itching may be secondary to histamine release/hives.  For Hives (urticaria); dermatographism or Itch: Start non sedating antihistamine (either Allegra 180mg , or Claritin 10mg , or Zyrtec 10mg ) daily.  All these are non-prescription ("Over the Counter").   Start out with 1 pill a day.   After a week if not improving may increase to 2 pills a day.   After another week if not improving may increase to 3 pills a day.   After another week if still not improving may take up to 4 pills a day. Stay at highest dose that keeps condition controlled, but only up to 4 pills a day. Stay at the controlling dose for at least 2 weeks. Contact office if taking 4 pills of antihistamine a day for at least 2 weeks without control of condition as other options may be available.  Due to recent changes in healthcare laws, you may see results of your pathology and/or laboratory studies on MyChart before the doctors have had a chance to review them. We understand that in some cases there may be results that are confusing or concerning to you. Please understand that not all results are received at the same time and often the doctors may need to interpret multiple results in order to provide you with the best plan of care or course of treatment. Therefore, we ask that you please give [place] 2 business days to thoroughly review all your results before contacting the office for clarification. Should we see a  critical lab result, you will be contacted sooner.   If You Need Anything After Your Visit  If you have any questions or concerns for your doctor, please call our main line at [phone] and press option 4 to reach your doctor's medical assistant. If no one answers, please leave a voicemail as directed and we will return your call as soon as possible. Messages left after 4 pm will be answered the following business day.   You may also send [place] a message via MyChart. We typically respond to MyChart messages within 1-2 business days.  For prescription refills, please ask your pharmacy to contact our office. Our fax number is [phone].  If you have an urgent issue when the clinic is closed that cannot wait until the next business day, you can page your doctor at the number below.    Please note that while we do our best to be available for urgent issues outside of office hours, we are not available 24/7.   If you have an urgent issue and are unable to reach [place], you may choose to seek medical care at your doctor's office, retail clinic, urgent care center, or emergency room.  If you have a medical emergency, please immediately call 911 or go to the emergency department.  Pager Numbers  - Dr. Gwen Pounds: [phone]  - Dr. Neale Burly: [phone]  - Dr. Roseanne Reno: [phone]  In  the event of inclement weather, please call our main line at [phone] for an update on the status of any delays or closures.  Dermatology Medication Tips: Please keep the boxes that topical medications come in in order to help keep track of the instructions about where and how to use these. Pharmacies typically print the medication instructions only on the boxes and not directly on the medication tubes.   If your medication is too expensive, please contact our office at [phone] option 4 or send [place] a message through MyChart.   We are unable to tell what your co-pay for medications will be in advance as  this is different depending on your insurance coverage. However, we may be able to find a substitute medication at lower cost or fill out paperwork to get insurance to cover a needed medication.   If a prior authorization is required to get your medication covered by your insurance company, please allow [place] 1-2 business days to complete this process.  Drug prices often vary depending on where the prescription is filled and some pharmacies may offer cheaper prices.  The website www.goodrx.com contains coupons for medications through different pharmacies. The prices here do not account for what the cost may be with help from insurance (it may be cheaper with your insurance), but the website can give you the price if you did not use any insurance.  - You can print the associated coupon and take it with your prescription to the pharmacy.  - You may also stop by our office during regular business hours and pick up a GoodRx coupon card.  - If you need your prescription sent electronically to a different pharmacy, notify our office through [facility] or by phone at [phone] option 4.     Si Usted Necesita Algo Despus de Su Visita  Tambin puede enviarnos un mensaje a travs de Clinical cytogeneticist. Por lo general respondemos a los mensajes de MyChart en el transcurso de 1 a 2 das hbiles.  Para renovar recetas, por favor pida a su farmacia que se ponga en contacto con nuestra oficina. Annie Sable de fax es [place] [phone].  Si tiene un asunto urgente cuando la clnica est cerrada y que no puede esperar hasta el siguiente da hbil, puede llamar/localizar a su doctor(a) al nmero que aparece a continuacin.   Por favor, tenga en cuenta que aunque hacemos todo lo posible para estar disponibles para asuntos urgentes fuera del horario de [place], no estamos disponibles las 24 horas del da, los 7 [address]  [address] de la [place].   Si tiene un problema urgente y no puede comunicarse con nosotros, puede optar por  buscar atencin mdica  en el consultorio de su doctor(a), en una clnica privada, en un centro de atencin urgente o en una sala de emergencias.  Si tiene Engineer, drilling, por favor llame inmediatamente al 911 o vaya a la sala de emergencias.  Nmeros de bper  - Dr. Gwen Pounds: [phone]  - Dra. Moye: [phone]  - Dra. Roseanne Reno: [phone]  En caso de inclemencias del [place], por favor llame a Lacy Duverney principal al [phone] para una actualizacin sobre el [place] de cualquier retraso o cierre.  Consejos para la medicacin en dermatologa: Por favor, guarde las cajas en las que vienen los medicamentos de uso tpico para ayudarle a seguir las instrucciones sobre dnde y cmo usarlos. Las farmacias generalmente imprimen las instrucciones del medicamento slo en las cajas y no directamente en los tubos del [place].   Si  su medicamento es muy caro, por favor, pngase en contacto con Rolm Gala llamando al [phone] y presione la opcin 4 o envenos un mensaje a travs de Clinical cytogeneticist.   No podemos decirle cul ser su copago por los medicamentos por adelantado ya que esto es diferente dependiendo de la cobertura de su seguro. Sin embargo, es posible que podamos encontrar un medicamento sustituto a Audiological scientist un formulario para que el seguro cubra el medicamento que se considera necesario.   Si se requiere una autorizacin previa para que su compaa de seguros [place] su medicamento, por favor permtanos de 1 a 2 das hbiles para completar [address]  Los precios de los medicamentos varan con frecuencia dependiendo del Environmental consultant de dnde se surte la receta y alguna farmacias pueden ofrecer precios ms baratos.  El sitio web www.goodrx.com tiene cupones para medicamentos de Health and safety inspector. Los precios aqu no tienen en cuenta lo que podra costar con la ayuda del seguro (puede ser ms barato con su seguro), pero el sitio web puede darle el precio si no  utiliz Tourist information centre manager.  - Puede imprimir el cupn correspondiente y llevarlo con su receta a la farmacia.  - Tambin puede pasar por nuestra oficina durante el horario de atencin regular y Education officer, museum una tarjeta de cupones de GoodRx.  - Si necesita que su receta se enve electrnicamente a una farmacia diferente, informe a nuestra oficina a travs de MyChart de [place] o por telfono llamando al [phone] y presione la opcin 4.

## 2023-06-09 ENCOUNTER — Encounter: Payer: Self-pay | Admitting: Dermatology

## 2023-06-14 ENCOUNTER — Ambulatory Visit: Payer: Medicaid Other

## 2023-06-16 ENCOUNTER — Ambulatory Visit: Payer: MEDICAID

## 2023-06-16 DIAGNOSIS — L80 Vitiligo: Secondary | ICD-10-CM | POA: Diagnosis not present

## 2023-06-16 NOTE — Progress Notes (Signed)
Patient here today for lightobox treatment for Vitiligo.   Patient treated front of body for 1 minute and 28 seconds and back of body 1 minute 28 seconds.   Evorn Gong RMA

## 2023-06-21 ENCOUNTER — Ambulatory Visit: Payer: MEDICAID

## 2023-06-21 DIAGNOSIS — L80 Vitiligo: Secondary | ICD-10-CM

## 2023-06-21 NOTE — Progress Notes (Signed)
Patient here today for lightobox treatment for Vitiligo.   Patient treated front of body for 1 minute and 38 seconds and back of body 1 minute 38 seconds.   Evorn Gong RMA

## 2023-06-24 ENCOUNTER — Ambulatory Visit: Payer: MEDICAID

## 2023-06-28 ENCOUNTER — Ambulatory Visit: Payer: MEDICAID

## 2023-06-28 DIAGNOSIS — L80 Vitiligo: Secondary | ICD-10-CM | POA: Diagnosis not present

## 2023-06-28 NOTE — Progress Notes (Signed)
Patient here today for lightobox treatment for Vitiligo.   Patient treated front of body for 1 minute and 48 seconds and back of body 1 minute 48 seconds.   Evorn Gong RMA

## 2023-06-30 ENCOUNTER — Ambulatory Visit: Payer: MEDICAID

## 2023-06-30 DIAGNOSIS — L80 Vitiligo: Secondary | ICD-10-CM

## 2023-06-30 NOTE — Progress Notes (Signed)
Patient here today for lightobox treatment for Vitiligo.   Patient treated front of body for 1 minute and 58 seconds and back of body 1 minute 58 seconds.   Evorn Gong RMA

## 2023-07-05 ENCOUNTER — Ambulatory Visit: Payer: MEDICAID

## 2023-07-05 DIAGNOSIS — L80 Vitiligo: Secondary | ICD-10-CM | POA: Diagnosis not present

## 2023-07-05 NOTE — Progress Notes (Signed)
Patient here today for lightobox treatment for Vitiligo.   Patient treated front of body for 2 minute and 8 seconds and back of body 2 minute 8 seconds.   Evorn Gong RMA

## 2023-07-07 ENCOUNTER — Ambulatory Visit: Payer: MEDICAID

## 2023-07-07 DIAGNOSIS — L80 Vitiligo: Secondary | ICD-10-CM | POA: Diagnosis not present

## 2023-07-07 NOTE — Progress Notes (Signed)
Patient here today for lightobox treatment for Vitiligo.   Patient treated front of body for 2 minute and 18 seconds and back of body 2 minute 18 seconds.   Dorathy Daft, RMA

## 2023-07-12 ENCOUNTER — Other Ambulatory Visit: Payer: Self-pay

## 2023-07-12 ENCOUNTER — Ambulatory Visit: Payer: MEDICAID

## 2023-07-12 ENCOUNTER — Encounter: Payer: Self-pay | Admitting: Intensive Care

## 2023-07-12 ENCOUNTER — Emergency Department
Admission: EM | Admit: 2023-07-12 | Discharge: 2023-07-12 | Disposition: A | Discharge status: Home / Self Care | Payer: MEDICAID | Attending: Emergency Medicine | Admitting: Emergency Medicine

## 2023-07-12 DIAGNOSIS — L80 Vitiligo: Secondary | ICD-10-CM

## 2023-07-12 DIAGNOSIS — X58XXXA Exposure to other specified factors, initial encounter: Secondary | ICD-10-CM | POA: Insufficient documentation

## 2023-07-12 DIAGNOSIS — J029 Acute pharyngitis, unspecified: Secondary | ICD-10-CM | POA: Diagnosis not present

## 2023-07-12 DIAGNOSIS — S0922XA Traumatic rupture of left ear drum, initial encounter: Secondary | ICD-10-CM | POA: Diagnosis not present

## 2023-07-12 DIAGNOSIS — S0991XA Unspecified injury of ear, initial encounter: Secondary | ICD-10-CM | POA: Diagnosis present

## 2023-07-12 DIAGNOSIS — H7292 Unspecified perforation of tympanic membrane, left ear: Secondary | ICD-10-CM

## 2023-07-12 MED ORDER — CIPROFLOXACIN-DEXAMETHASONE 0.3-0.1 % OT SUSP: 4.0000 [drp] | Freq: Two times a day (BID) | OTIC | 0 refills | Status: AC

## 2023-07-12 MED ORDER — AMOXICILLIN 500 MG PO CAPS: 500.0000 mg | ORAL_CAPSULE | Freq: Three times a day (TID) | ORAL | 0 refills | Status: AC

## 2023-07-12 NOTE — ED Provider Notes (Signed)
[facility] Provider Note    Event Date/Time   First MD Initiated Contact with Patient 07/12/23 1513     (approximate)   History   Ear [name]   HPI  [name] is a 47 y.o. female  [name], Whitney [name], Whitney [name], Whitney [name] the past 2 days. She has also had a sore throat, but denies cough, nausea, vomiting, or fever.      Physical Exam   Triage Vital Signs: ED Triage Vitals  Encounter Vitals Group     BP 07/12/23 1334 97/62     Systolic BP Percentile --      Diastolic BP Percentile --      Pulse Rate 07/12/23 1334 70     Resp --      Temp 07/12/23 1334 97.9 F (36.6 C)     Temp Source 07/12/23 1334 Oral     SpO2 07/12/23 1334 99 %     Weight 07/12/23 1335 136 lb 11 oz (62 kg)     Height 07/12/23 1335 5' 4.96" (1.65 m)     Head Circumference --      Peak Flow --      [name] Score 07/12/23 1334 10     [name] Loc --      [name] Education --      Exclude from Growth Chart --     Most recent vital signs: Vitals:   07/12/23 1334  BP: 97/62  Pulse: 70  Temp: 97.9 F (36.6 C)  SpO2: 99%    General: Awake, no distress.  CV:  Good peripheral perfusion.  Resp:  Normal effort. Breath sounds clear. Abd:  No distention.  Other:  Left TM erythematous with serosanguinous drainage and small rupture, Right TM with air-fluid level noted.   ED Results / Procedures / Treatments   Labs (all labs ordered are listed, but only abnormal results are displayed) Labs Reviewed - No data to display   EKG  Not indicated.   RADIOLOGY  Image and radiology report reviewed and interpreted by me. Radiology report consistent with the same.  Not indicated.  PROCEDURES:  Critical Care performed: No  Procedures   MEDICATIONS ORDERED IN ED:  Medications - No data to display   IMPRESSION / MDM /  ASSESSMENT AND PLAN / ED COURSE   I have reviewed the triage note.  Differential diagnosis includes, but is not limited to, tympanic membrane rupture, otitis media/externa, serous OM.  Patient's presentation is most consistent with acute illness / injury with system symptoms.  47 year old female presents to the emergency department [name] treatment and evaluation of otalgia and sore throat. See HPI.  On exam, left TM is perforated with serosanguineous drainage.  Right TM is injected with air-fluid level.  Plan will be to discharge patient home on Ciprodex and amoxicillin.  She will be given follow-up information [name] ENT specialist.  She was encouraged to return to the emergency department [name] symptoms that change or worsen or [name] new concerns if she is unable to schedule appointment with primary care or the specialist.  FINAL CLINICAL IMPRESSION(S) / ED DIAGNOSES   Final diagnoses:  Tympanic membrane rupture, left     Rx / DC Orders   ED Discharge Orders          Ordered    amoxicillin (AMOXIL) 500 MG capsule  3 times daily        07/12/23 1529    ciprofloxacin-dexamethasone (CIPRODEX) OTIC suspension  2 times daily        07/12/23 1529             Note:  This document was prepared using Dragon voice recognition software and may include unintentional dictation errors.   Chinita Pester, FNP 07/12/23 1814    Jene Every, MD 07/12/23 [phone]

## 2023-07-12 NOTE — Progress Notes (Signed)
Patient here today for lightobox treatment for Vitiligo.   Patient treated front of body for 2 minute and 28 seconds and back of body 2 minute 28 seconds.   Dorathy Daft, RMA

## 2023-07-12 NOTE — ED Triage Notes (Signed)
Arabic interpretor used during triage. Patient c/o bilateral ear pain with bloody secretion. Also reports throat pain. Symptoms started 2 days ago.

## 2023-07-13 ENCOUNTER — Telehealth: Payer: Self-pay

## 2023-07-13 DIAGNOSIS — L80 Vitiligo: Secondary | ICD-10-CM

## 2023-07-13 NOTE — Telephone Encounter (Signed)
Orders needed for patient to continue lightbox twice weekly until follow up in October. Okay to enter?

## 2023-07-15 ENCOUNTER — Ambulatory Visit: Payer: MEDICAID

## 2023-07-15 DIAGNOSIS — L80 Vitiligo: Secondary | ICD-10-CM | POA: Diagnosis not present

## 2023-07-15 NOTE — Progress Notes (Signed)
Patient here today for lightobox treatment for Vitiligo.   Patient treated front of body for 2 minute and 38 seconds and back of body 2 minute 38 seconds.   Dorathy Daft, RMA

## 2023-07-26 ENCOUNTER — Ambulatory Visit (INDEPENDENT_AMBULATORY_CARE_PROVIDER_SITE_OTHER): Payer: MEDICAID

## 2023-07-26 DIAGNOSIS — L80 Vitiligo: Secondary | ICD-10-CM

## 2023-07-26 NOTE — Progress Notes (Signed)
Patient here today for lightobox treatment for Vitiligo.   Patient treated front of body for 2 minute and 48 seconds and back of body 2 minute 48 seconds.   Dorathy Daft, RMA

## 2023-07-28 ENCOUNTER — Ambulatory Visit (INDEPENDENT_AMBULATORY_CARE_PROVIDER_SITE_OTHER): Payer: MEDICAID

## 2023-07-28 DIAGNOSIS — L80 Vitiligo: Secondary | ICD-10-CM | POA: Diagnosis not present

## 2023-07-28 NOTE — Progress Notes (Signed)
Patient here today for lightobox treatment for Vitiligo.   Patient treated front of body for 2 minute and 58 seconds and back of body 2 minute 58 seconds.   Dorathy Daft, RMA

## 2023-08-02 ENCOUNTER — Ambulatory Visit (INDEPENDENT_AMBULATORY_CARE_PROVIDER_SITE_OTHER): Payer: MEDICAID

## 2023-08-02 DIAGNOSIS — L80 Vitiligo: Secondary | ICD-10-CM | POA: Diagnosis not present

## 2023-08-02 NOTE — Progress Notes (Signed)
Patient here today for lightobox treatment for Vitiligo.   Patient treated front of body for 3 minute and 8 seconds and back of body 3 minute 8 seconds.   Dorathy Daft, RMA

## 2023-08-04 ENCOUNTER — Ambulatory Visit: Payer: MEDICAID

## 2023-08-10 ENCOUNTER — Ambulatory Visit (INDEPENDENT_AMBULATORY_CARE_PROVIDER_SITE_OTHER): Payer: MEDICAID

## 2023-08-10 DIAGNOSIS — L80 Vitiligo: Secondary | ICD-10-CM | POA: Diagnosis not present

## 2023-08-10 NOTE — Progress Notes (Signed)
Patient here today for lightobox treatment for Vitiligo.   Patient treated front of body for 3 minute and 18 seconds and back of body 3 minute 18 seconds.   Dorathy Daft, RMA

## 2023-08-17 ENCOUNTER — Ambulatory Visit: Payer: MEDICAID

## 2023-08-18 ENCOUNTER — Ambulatory Visit: Payer: MEDICAID

## 2023-08-18 DIAGNOSIS — L80 Vitiligo: Secondary | ICD-10-CM | POA: Diagnosis not present

## 2023-08-18 NOTE — Progress Notes (Signed)
Patient here today for lightobox treatment for Vitiligo.   Patient treated front of body for 3 minute and 28 seconds and back of body 3 minute 28 seconds.   Dorathy Daft, RMA

## 2023-08-23 ENCOUNTER — Ambulatory Visit: Payer: MEDICAID

## 2023-08-23 DIAGNOSIS — L80 Vitiligo: Secondary | ICD-10-CM

## 2023-08-23 NOTE — Progress Notes (Signed)
Patient here today for lightobox treatment for Vitiligo.   Patient treated front of body for 3 minute and 38 seconds and back of body 3 minute 38 seconds.   Dorathy Daft, RMA

## 2023-08-25 ENCOUNTER — Ambulatory Visit: Payer: MEDICAID

## 2023-08-25 DIAGNOSIS — L80 Vitiligo: Secondary | ICD-10-CM | POA: Diagnosis not present

## 2023-08-25 NOTE — Progress Notes (Signed)
Patient here today for lightobox treatment for Vitiligo.   Patient treated front of body for 3 minute and 48 seconds and back of body 3 minute 48 seconds.   Evorn Gong, RMA

## 2023-08-26 ENCOUNTER — Ambulatory Visit: Payer: Medicaid Other | Admitting: Dermatology

## 2023-08-30 ENCOUNTER — Ambulatory Visit (INDEPENDENT_AMBULATORY_CARE_PROVIDER_SITE_OTHER): Payer: MEDICAID

## 2023-08-30 DIAGNOSIS — L80 Vitiligo: Secondary | ICD-10-CM | POA: Diagnosis not present

## 2023-08-30 NOTE — Progress Notes (Signed)
Patient here today for lightobox treatment for Vitiligo.   Patient treated front of body for 3 minute and 58 seconds and back of body 3 minute 58 seconds.   Evorn Gong, RMA

## 2023-09-01 ENCOUNTER — Ambulatory Visit: Payer: MEDICAID

## 2023-09-01 DIAGNOSIS — L80 Vitiligo: Secondary | ICD-10-CM

## 2023-09-01 NOTE — Progress Notes (Signed)
Patient here today for lightbox treatment for Vitiligo.   Patient treated front of body for 4 minute and 8 seconds and back of body 4 minute 8 seconds.   Evorn Gong, RMA

## 2023-09-06 ENCOUNTER — Ambulatory Visit: Payer: MEDICAID

## 2023-09-06 ENCOUNTER — Telehealth: Payer: Self-pay

## 2023-09-06 DIAGNOSIS — L80 Vitiligo: Secondary | ICD-10-CM

## 2023-09-06 NOTE — Telephone Encounter (Signed)
Patient states she is getting a very itchy rash after she does a lightbox treatment. 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 Patient wants to take a break from light treatment at this time and asking can we send in anything to help control the itch on her chest and abdomen?   Patient scheduled to see you 10/16 for follow up.

## 2023-09-07 MED ORDER — TACROLIMUS 0.1 % EX OINT
TOPICAL_OINTMENT | CUTANEOUS | 0 refills | Status: DC
Start: 2023-09-07 — End: 2024-01-10

## 2023-09-07 MED ORDER — MOMETASONE FUROATE 0.1 % EX CREA: 1.0000 | TOPICAL_CREAM | Freq: Every day | CUTANEOUS | 0 refills | Status: DC

## 2023-09-07 NOTE — Telephone Encounter (Signed)
Called patient but no answer and no VM. aw

## 2023-09-07 NOTE — Telephone Encounter (Signed)
Patient advised with interpreter services.

## 2023-09-08 ENCOUNTER — Ambulatory Visit: Payer: MEDICAID

## 2023-09-29 ENCOUNTER — Ambulatory Visit: Payer: Medicaid Other | Admitting: Dermatology

## 2023-10-11 ENCOUNTER — Ambulatory Visit: Payer: Medicaid Other | Admitting: Dermatology

## 2023-10-11 ENCOUNTER — Ambulatory Visit: Payer: Self-pay

## 2023-10-13 ENCOUNTER — Encounter: Payer: Self-pay | Admitting: Licensed Practical Nurse

## 2023-10-13 ENCOUNTER — Other Ambulatory Visit (HOSPITAL_COMMUNITY)
Admission: RE | Admit: 2023-10-13 | Discharge: 2023-10-13 | Disposition: A | Discharge status: Home / Self Care | Payer: Medicaid Other | Source: Ambulatory Visit | Attending: Licensed Practical Nurse | Admitting: Licensed Practical Nurse

## 2023-10-13 ENCOUNTER — Ambulatory Visit (INDEPENDENT_AMBULATORY_CARE_PROVIDER_SITE_OTHER): Payer: Self-pay | Admitting: Licensed Practical Nurse

## 2023-10-13 VITALS — BP 111/78 | HR 72 | Ht 64.0 in | Wt 129.0 lb

## 2023-10-13 DIAGNOSIS — M549 Dorsalgia, unspecified: Secondary | ICD-10-CM

## 2023-10-13 DIAGNOSIS — N912 Amenorrhea, unspecified: Secondary | ICD-10-CM | POA: Insufficient documentation

## 2023-10-13 DIAGNOSIS — N898 Other specified noninflammatory disorders of vagina: Secondary | ICD-10-CM | POA: Insufficient documentation

## 2023-10-13 DIAGNOSIS — N926 Irregular menstruation, unspecified: Secondary | ICD-10-CM

## 2023-10-13 DIAGNOSIS — R5383 Other fatigue: Secondary | ICD-10-CM

## 2023-10-13 DIAGNOSIS — Z1231 Encounter for screening mammogram for malignant neoplasm of breast: Secondary | ICD-10-CM

## 2023-10-13 DIAGNOSIS — R42 Dizziness and giddiness: Secondary | ICD-10-CM

## 2023-10-13 DIAGNOSIS — R300 Dysuria: Secondary | ICD-10-CM

## 2023-10-13 DIAGNOSIS — R399 Unspecified symptoms and signs involving the genitourinary system: Secondary | ICD-10-CM

## 2023-10-13 LAB — POCT URINALYSIS DIPSTICK
Bilirubin, UA: NEGATIVE
Blood, UA: NEGATIVE
Glucose, UA: NEGATIVE
Ketones, UA: NEGATIVE
Leukocytes, UA: NEGATIVE
Nitrite, UA: NEGATIVE
Protein, UA: POSITIVE — AB
Spec Grav, UA: 1.015 (ref 1.010–1.025)
Urobilinogen, UA: 0.2 U/dL
pH, UA: 7 (ref 5.0–8.0)

## 2023-10-13 MED ORDER — MEDROXYPROGESTERONE ACETATE 10 MG PO TABS: 10.0000 mg | ORAL_TABLET | Freq: Every day | ORAL | 2 refills | Status: AC

## 2023-10-13 NOTE — Progress Notes (Signed)
HPI:      Interpretor present for visit and exam. Pt with multiple complaints  Amenorrhea: Whitney [name] has not had a cycle since May. She has always and irregular cycles. She desires another pregnancy. In [place] she has had an HSG and then was able to become pregnant. She has been experiencing some hot flashes or night sweats for the past 2-3 months. She is not sure when the women in her family go through menopause. She is a [id number]. She was evaluated about 9 months ago, she  has been dealign with a skin issue and now would like to revisit her concern for fertility. On review of her chart she saw Dr Alvester Morin on 07/07/22-she reported then that she always required medication to get pregnant. She was prescribed Provera and Letrozole at that time. The pt did not say whether or not she used these medications. She did have a normal pelvic US.   UTI/back pain symptoms Has had increased urination x 1 week, does have right sided back pain x 1 week -the pain radiates from the back to the front, it is stabbing, dysuria x 5 days denies fevers . She has not tried anything to help with the pain. 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 She tried an ointment that a friend gave her, it may have helped. She does have pain with IC, but this is not new.   Fatigue and dizziness: this was mentioned at the conclusion of the visit, pt eats 2 meals a day plus occasional snack, she admits she does not drink enough fluids. She was treated for hypothyroidism in [place]   Was told she needed a mammogram, it has never been ordered     PMHx: She  has a past medical history of Anemia, Herniated cervical disc, Pituitary abnormality (HCC), TB lung, latent, and Vitiligo (06/15/2022). Also,  has a past surgical history that includes Laparoscopic oopherectomy (Left, 2002); Ovarian cyst drainage (2021); and Dilation and curettage of uterus., family  history includes Diabetes in her father; GI Disease in her mother; Hypertension in her father and mother.,  reports that she has never smoked. She has never used smokeless tobacco. She reports that she does not drink alcohol and does not use drugs.  She  Current Outpatient Medications:    famotidine (PEPCID) 20 MG tablet, Take 1 tablet (20 mg total) by mouth 2 (two) times daily., Disp: 60 tablet, Rfl: 1   ketoconazole (NIZORAL) 2 % shampoo, apply three times per week, massage into scalp and leave in for 5 -  10 minutes before rinsing out, Disp: 120 mL, Rfl: 11   loratadine (CLARITIN) 10 MG tablet, Take one tab po qhs for allergies, Disp: 30 tablet, Rfl: 11   medroxyPROGESTERone (PROVERA) 10 MG tablet, Take 1 tablet (10 mg total) by mouth daily. Use for ten days, Disp: 10 tablet, Rfl: 2   minoxidil (LONITEN) 2.5 MG tablet, Take 1 tablet (2.5 mg total) by mouth daily., Disp: 30 tablet, Rfl: 6   mometasone (ELOCON) 0.1 % cream, Apply 1 Application topically daily. Apply topically to affected itchy areas daily 5 x weekly, Disp: 90 g, Rfl: 0   mometasone (ELOCON) 0.1 % lotion, Apply topically to itchy areas of scalp 3 to 4 days weekly prn, Disp: 60 mL, Rfl: 1   montelukast (SINGULAIR) 10 MG tablet, Take 1 tablet (10 mg total) by mouth at bedtime., Disp: 30  tablet, Rfl: 3   tacrolimus (PROTOPIC) 0.1 % ointment, Apply to Hands, wrist and neck daily for vitiligo and once daily to chest for rash, Disp: 100 g, Rfl: 0   amoxicillin (AMOXIL) 500 MG capsule, Take 1 capsule (500 mg total) by mouth 3 (three) times daily. (Patient not taking: Reported on 10/13/2023), Disp: 30 capsule, Rfl: 0   augmented betamethasone dipropionate (DIPROLENE) 0.05 % ointment, Apply small amount at night (Patient not taking: Reported on 07/07/2022), Disp: 30 g, Rfl: 1   citalopram (CELEXA) 10 MG tablet, Take 1 tablet (10 mg total) by mouth daily. (Patient not taking: Reported on 10/22/2021), Disp: 30 tablet, Rfl: 3   diclofenac Sodium  (VOLTAREN) 1 % GEL, Apply 2 g topically 2 (two) times daily as needed. To right knee (Patient not taking: Reported on 07/07/2022), Disp: 150 g, Rfl: 3   fluticasone (FLONASE) 50 MCG/ACT nasal spray, Place 2 sprays into both nostrils daily. (Patient not taking: Reported on 07/07/2022), Disp: 16 mL, Rfl: 0   letrozole (FEMARA) 2.5 MG tablet, Take 1 tablet (2.5 mg total) by mouth daily. Take on days 3 to 7 following a spontaneous menses or progestin-induced bleed. (Patient not taking: Reported on 06/08/2023), Disp: 5 tablet, Rfl: 2   neomycin-bacitracin-polymyxin (NEOSPORIN) OINT, Apply 1 application topically in the morning, at noon, and at bedtime. 3 drops each ear, in the morning then again at noon then again in the evening then again before bed. For 10 days. (Patient not taking: Reported on 07/07/2022), Disp: , Rfl:    omeprazole (PRILOSEC) 20 MG capsule, Take 1 capsule (20 mg total) by mouth daily. (Patient not taking: Reported on 10/22/2021), Disp: 30 capsule, Rfl: 3   predniSONE (DELTASONE) 10 MG tablet, Take 6 tablets  today, on day 2 take 5 tablets, day 3 take 4 tablets, day 4 take 3 tablets, day 5 take  2 tablets and 1 tablet the last day (Patient not taking: Reported on 07/07/2022), Disp: 21 tablet, Rfl: 0  Also, has No Known Allergies.  ROS see HPI   Objective: BP 111/78   Pulse 72   Ht 5\' 4"  (1.626 m)   Wt 129 lb (58.5 kg)   BMI 22.14 kg/m  Physical Exam Constitutional:      Appearance: Normal appearance.  Genitourinary:     Vulva normal.     Genitourinary Comments: Normal urethral meatus, no lesions   Pulmonary:     Effort: Pulmonary effort is normal.  Abdominal:     General: Abdomen is flat.     Tenderness: There is right CVA tenderness.     Comments: Some tenderness over LLQ No tenderness over suprapubic area   Musculoskeletal:     Cervical back: Normal range of motion.  Neurological:     General: No focal deficit present.     Mental Status: She is alert.  Skin:    General:  Skin is warm.  Psychiatric:        Mood and Affect: Mood normal.   UA positive for protein all else negative   ASSESSMENT/PLAN:   Amenorrhea   Problem List Items Addressed This Visit   None Visit Diagnoses     Encounter for screening mammogram for breast cancer    -  Primary   Relevant Orders   MM DIGITAL SCREENING BILATERAL   Dysuria       Relevant Orders   POCT Urinalysis Dipstick (Completed)   Urine Culture   Vaginal discharge       Relevant Orders  POCT Urinalysis Dipstick (Completed)   Urine Culture   Cervicovaginal ancillary only   Irregular periods/menstrual cycles       Relevant Orders   POCT Urinalysis Dipstick (Completed)   Urine Culture   Amenorrhea       Relevant Medications   medroxyPROGESTERone (PROVERA) 10 MG tablet   Other Relevant Orders   FSH/LH   TSH + free T4   Estradiol   Progesterone   Anti mullerian hormone   Other fatigue       Relevant Orders   CBC w/Diff/Platelet   Dizziness       Relevant Orders   CBC w/Diff/Platelet        Discussed with pt the amenorrhea maybe related to her age, will do hormone labs, if they are abnormal I will refer to her REI, if they are WNL we can consider ovulation stimulating medication, will do progesterone challenge test   This CNM to be in contact with pr next week once lab are resulted   Carie Caddy, CNM  [facility]  10/13/23  5:19 PM

## 2023-10-14 LAB — CERVICOVAGINAL ANCILLARY ONLY
Chlamydia: NEGATIVE
Comment: NEGATIVE
Comment: NORMAL
Neisseria Gonorrhea: NEGATIVE

## 2023-10-15 LAB — URINE CULTURE: Organism ID, Bacteria: NO GROWTH

## 2023-10-24 LAB — CBC WITH DIFFERENTIAL/PLATELET
Basophils Absolute: 0 10*3/uL (ref 0.0–0.2)
Basos: 0 %
EOS (ABSOLUTE): 0.5 10*3/uL — ABNORMAL HIGH (ref 0.0–0.4)
Eos: 10 %
Hematocrit: 39 % (ref 34.0–46.6)
Hemoglobin: 12.9 g/dL (ref 11.1–15.9)
Immature Grans (Abs): 0 10*3/uL (ref 0.0–0.1)
Immature Granulocytes: 0 %
Lymphocytes Absolute: 1.3 10*3/uL (ref 0.7–3.1)
Lymphs: 24 %
MCH: 30.4 pg (ref 26.6–33.0)
MCHC: 33.1 g/dL (ref 31.5–35.7)
MCV: 92 fL (ref 79–97)
Monocytes Absolute: 0.3 10*3/uL (ref 0.1–0.9)
Monocytes: 5 %
Neutrophils Absolute: 3.2 10*3/uL (ref 1.4–7.0)
Neutrophils: 61 %
Platelets: 200 10*3/uL (ref 150–450)
RBC: 4.24 x10E6/uL (ref 3.77–5.28)
RDW: 12.8 % (ref 11.7–15.4)
WBC: 5.4 10*3/uL (ref 3.4–10.8)

## 2023-10-24 LAB — PROGESTERONE: Progesterone: 0.2 ng/mL

## 2023-10-24 LAB — FSH/LH
FSH: 87.6 m[IU]/mL
LH: 54.4 m[IU]/mL

## 2023-10-24 LAB — ANTI MULLERIAN HORMONE: ANTI-MULLERIAN HORMONE (AMH): <0.015 ng/mL

## 2023-10-24 LAB — TSH+FREE T4
Free T4: 1.4 ng/dL (ref 0.82–1.77)
TSH: 2.73 u[IU]/mL (ref 0.450–4.500)

## 2023-10-24 LAB — ESTRADIOL: Estradiol: <5 pg/mL

## 2023-10-25 ENCOUNTER — Telehealth: Payer: Self-pay | Admitting: Licensed Practical Nurse

## 2023-10-25 NOTE — Telephone Encounter (Signed)
TC to pt Used Arabic Interpretor # [id number] Most of your labs were normal, your TSH and CBC were normal. I am not sure why you are experiencing fatigue. You do not have a UTI. In regards to fertility, I do not think you will be able to become pregnant, the lab that we use to check ovarian reserve was low to almost undetectable, you appear to be in perimenopause, We diagnose menopause once someone has missed 12 consecutive  periods.  You are due for an annual exam Pt would like to schedule a visit with a Doctor, attempted to transfer to front desk but accidentally hung up on call. Front desk messaged to call pt to schedule appointment Carie Caddy, CNM   [facility]  10/25/2023 3:20 PM

## 2023-10-27 NOTE — Progress Notes (Unsigned)
GYNECOLOGY: ANNUAL EXAM   Subjective:    PCP: Carlean Jews, PA-C Whitney [name] is a 47 y.o. female [phone] who presents for annual wellness visit. Recently had fertility workup with CNM and labs showed estradiol <5, FSH 87.6, AMH undetectable.   Well Woman Visit:  GYN HISTORY:  Patient's last menstrual period was 04/29/2023.     Menstrual History: OB History     Gravida  6   Para  3   Term  3   Preterm  0   AB  3   Living  3      SAB  0   IAB  0   Ectopic  0   Multiple      Live Births  2           Menarche age: 39 Patient's last menstrual period was 04/29/2023. Period Duration (Days): 4-6 Period Pattern: (!) Irregular Menstrual Flow: Light Dysmenorrhea: (!) Severe Dysmenorrhea Symptoms: Cramping   Sexually active: Yes Number of sexual partners: 1 Gender of sexual Partners: male Social History   Substance and Sexual Activity  Sexual Activity Yes   Partners: Male   Birth control/protection: None   Comment: 2 boys and 1 girl   Contraceptive methods: no method Dyspareunia? No STI history: NO  STI/HIV testing or immunizations needed? No.   Health Maintenance: -Last pap: was normal 10/22/21 NILM, HPV NEG --> Any abnormals: NO  -Last mammogram: no --> Any abnormals? no -Last colon cancer screen: reports colonoscopy 8-9 mos ago, records unavail -Last DEXA scan: No  -FMH of Breast / Colon / Cervical cancer: NO  -Vaccines:  Immunization History  Administered Date(s) Administered   Hepatitis B 11/21/2020   Hepatitis B, ADULT 04/14/2022   Hepatitis B, PED/ADOLESCENT 01/20/2022   Influenza, Seasonal, Injecte, Preservative Fre 01/20/2022   MMR 11/21/2020, 05/27/2021   PFIZER Comirnaty(Gray Top)Covid-19 Tri-Sucrose Vaccine 01/16/2021, 02/09/2021   Tdap 05/27/2021, 05/27/2021, 01/20/2022   Last Tdap: UTD / Flu: Completed / COVID: UTD / Gardasil: N/A / Shingles (50+): N/A / PCV20:  -Hep C screen: COMPLETED -Last lipid / glucose  screening: 12/25/22  > Exercise: housecleaning,Walking Running very active > Dietary Supplements: Folate: No;  Calcium: No; Vitamin D: No > Body mass index is 25 kg/m.  > Recent dental visit No. > Seat Belt Use: Yes.   > Texting and driving? No. > Guns in the house No. > Recreational or other drug use: denied.   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 Feeling nervous, anxious or on edge: Not at all (0) Not being able to stop or control worrying: Not at all (0)} Score: 0 _________________________________________________________  Current Outpatient Medications  Medication Sig Dispense Refill   amoxicillin (AMOXIL) 500 MG capsule Take 1 capsule (500 mg total) by mouth 3 (three) times daily. (Patient not taking: Reported on 10/13/2023) 30 capsule 0   augmented betamethasone dipropionate (DIPROLENE) 0.05 % ointment Apply small amount at night (Patient not taking: Reported on 07/07/2022) 30 g 1   citalopram (CELEXA)  10 MG tablet Take 1 tablet (10 mg total) by mouth daily. (Patient not taking: Reported on 10/22/2021) 30 tablet 3   diclofenac Sodium (VOLTAREN) 1 % GEL Apply 2 g topically 2 (two) times daily as needed. To right knee (Patient not taking: Reported on 07/07/2022) 150 g 3   famotidine (PEPCID) 20 MG tablet Take 1 tablet (20 mg total) by mouth 2 (two) times daily. (Patient not taking: Reported on 10/28/2023) 60 tablet 1   fluticasone (FLONASE) 50 MCG/ACT nasal spray Place 2 sprays into both nostrils daily. (Patient not taking: Reported on 07/07/2022) 16 mL 0   ketoconazole (NIZORAL)  2 % shampoo apply three times per week, massage into scalp and leave in for 5 -  10 minutes before rinsing out (Patient not taking: Reported on 10/28/2023) 120 mL 11   letrozole (FEMARA) 2.5 MG tablet Take 1 tablet (2.5 mg total) by mouth daily. Take on days 3 to 7 following a spontaneous menses or progestin-induced bleed. (Patient not taking: Reported on 06/08/2023) 5 tablet 2   loratadine (CLARITIN) 10 MG tablet Take one tab po qhs for allergies (Patient not taking: Reported on 10/28/2023) 30 tablet 11   medroxyPROGESTERone (PROVERA) 10 MG tablet Take 1 tablet (10 mg total) by mouth daily. Use for ten days (Patient not taking: Reported on 10/28/2023) 10 tablet 2   minoxidil (LONITEN) 2.5 MG tablet Take 1 tablet (2.5 mg total) by mouth daily. (Patient not taking: Reported on 10/28/2023) 30 tablet 6   mometasone (ELOCON) 0.1 % cream Apply 1 Application topically daily. Apply topically to affected itchy areas daily 5 x weekly (Patient not taking: Reported on 10/28/2023) 90 g 0   mometasone (ELOCON) 0.1 % lotion Apply topically to itchy areas of scalp 3 to 4 days weekly prn (Patient not taking: Reported on 10/28/2023) 60 mL 1   montelukast (SINGULAIR) 10 MG tablet Take 1 tablet (10 mg total) by mouth at bedtime. (Patient not taking: Reported on 10/28/2023) 30 tablet 3   neomycin-bacitracin-polymyxin (NEOSPORIN) OINT Apply 1 application topically in the morning, at noon, and at bedtime. 3 drops each ear, in the morning then again at noon then again in the evening then again before bed. For 10 days. (Patient not taking: Reported on 07/07/2022)     omeprazole (PRILOSEC) 20 MG capsule Take 1 capsule (20 mg total) by mouth daily. (Patient not taking: Reported on 10/22/2021) 30 capsule 3   predniSONE (DELTASONE) 10 MG tablet Take 6 tablets  today, on day 2 take 5 tablets, day 3 take 4 tablets, day 4 take 3 tablets, day 5 take  2 tablets and 1 tablet the last day (Patient not taking: Reported on 07/07/2022) 21 tablet 0    tacrolimus (PROTOPIC) 0.1 % ointment Apply to Hands, wrist and neck daily for vitiligo and once daily to chest for rash (Patient not taking: Reported on 10/28/2023) 100 g 0   No current facility-administered medications for this visit.   No Known Allergies  Past Medical History:  Diagnosis Date   Anemia    Herniated cervical disc    Pituitary abnormality (HCC)    TB lung, latent    Vitiligo 06/15/2022   pt has all over body   Past Surgical History:  Procedure Laterality Date   DILATION AND CURETTAGE OF UTERUS     LAPAROSCOPIC OOPHERECTOMY Left 2002   OVARIAN CYST DRAINAGE  2021    Review Of Systems  Constitutional: Denied constitutional symptoms, night sweats, recent illness, fatigue, fever, insomnia and  weight loss.  Eyes: Denied eye symptoms, eye pain, photophobia, vision change and visual disturbance.  Ears/Nose/Throat/Neck: Denied ear, nose, throat or neck symptoms, hearing loss, nasal discharge, sinus congestion and sore throat.  Cardiovascular: Denied cardiovascular symptoms, arrhythmia, chest pain/pressure, edema, exercise intolerance, orthopnea and palpitations.  Respiratory: Denied pulmonary symptoms, asthma, pleuritic pain, productive sputum, cough, dyspnea and wheezing.  Gastrointestinal: Denied, gastro-esophageal reflux, melena, nausea and vomiting.  Genitourinary: Denied genitourinary symptoms including pelvic relaxation issues, and urinary complaints. Endorses vaginal discharge and itching x 1 month  Musculoskeletal: Denied musculoskeletal symptoms, stiffness, swelling, muscle weakness and myalgia.  Dermatologic: Denied dermatology symptoms, rash and scar.  Neurologic: Denied neurology symptoms, dizziness, headache, neck pain and syncope.  Psychiatric: Denied psychiatric symptoms, anxiety and depression.  Endocrine: Denied endocrine symptoms including hot flashes and night sweats.      Objective:    BP 101/67   Pulse 69   Ht 5' (1.524 m)   Wt 128 lb (58.1  kg)   LMP 04/29/2023   BMI 25.00 kg/m   Constitutional: Well-developed, well-nourished female in no acute distress Neurological: Alert and oriented to person, place, and time Psychiatric: Mood and affect appropriate Skin: No rashes or lesions Neck: Supple without masses. Trachea is midline.Thyroid is normal size without masses Lymphatics: No cervical, axillary, supraclavicular, or inguinal adenopathy noted Respiratory: Clear to auscultation bilaterally. Good air movement with normal work of breathing. Cardiovascular: Regular rate and rhythm. Extremities grossly normal, nontender with no edema; pulses regular Gastrointestinal: Soft, nontender, nondistended. No masses or hernias appreciated. No hepatosplenomegaly. No fluid wave. No rebound or guarding. Breast Exam: normal appearance, no masses or tenderness, Inspection negative, No nipple retraction or dimpling, No nipple discharge or bleeding, No axillary or supraclavicular adenopathy Genitourinary:         External Genitalia: Normal female genitalia    Vagina: Normal mucosa, no lesions.    Cervix: No lesions, normal size and consistency; no cervical motion tenderness; non-friable; Pap not obtained.    Uterus: Normal size and contour; smooth, mobile, NT, midposition. Adnexae: Non-palpable and non-tender Perineum/Anus: No lesions Rectal: deferred    Assessment/Plan:    Whitney [name] is a 47 y.o. female [phone] with normal well-woman gynecologic exam.  -Screenings:  Pap: due 10/2026, pelvic exam today normal, swab obtained for itching Mammogram: ordered Colon: per pt, colonoscopy completed early 2024, normal. Labs: per PCP GAD/ PHQ-2 = 0 -Contraception: none, perimenopausal -Vaccines: UTD  -Healthy lifestyle modifications discussed: multivitamin, diet, exercise, sunscreen, tobacco and alcohol use. Emphasized importance of regular physical activity.  -Folate and Calcium and Vit D recommendation reviewed.  -All questions answered  to patient's satisfaction.  -RTC 1 yr for annual, sooner prn.    Julieanne Manson, DO [facility]

## 2023-10-28 ENCOUNTER — Other Ambulatory Visit (HOSPITAL_COMMUNITY)
Admission: RE | Admit: 2023-10-28 | Discharge: 2023-10-28 | Disposition: A | Discharge status: Home / Self Care | Payer: MEDICAID | Source: Ambulatory Visit | Attending: Obstetrics | Admitting: Obstetrics

## 2023-10-28 ENCOUNTER — Encounter: Payer: Self-pay | Admitting: Obstetrics

## 2023-10-28 ENCOUNTER — Ambulatory Visit: Payer: Medicaid Other | Admitting: Obstetrics

## 2023-10-28 VITALS — BP 101/67 | HR 69 | Ht 60.0 in | Wt 128.0 lb

## 2023-10-28 DIAGNOSIS — N898 Other specified noninflammatory disorders of vagina: Secondary | ICD-10-CM | POA: Diagnosis present

## 2023-10-28 DIAGNOSIS — Z01419 Encounter for gynecological examination (general) (routine) without abnormal findings: Secondary | ICD-10-CM | POA: Diagnosis not present

## 2023-10-28 DIAGNOSIS — Z1231 Encounter for screening mammogram for malignant neoplasm of breast: Secondary | ICD-10-CM

## 2023-10-29 LAB — CERVICOVAGINAL ANCILLARY ONLY
Bacterial Vaginitis (gardnerella): NEGATIVE
Candida Glabrata: NEGATIVE
Candida Vaginitis: NEGATIVE
Chlamydia: NEGATIVE
Comment: NEGATIVE
Comment: NEGATIVE
Comment: NEGATIVE
Comment: NEGATIVE
Comment: NEGATIVE
Comment: NORMAL
Neisseria Gonorrhea: NEGATIVE
Trichomonas: NEGATIVE

## 2023-11-01 ENCOUNTER — Ambulatory Visit: Payer: Medicaid Other | Admitting: Obstetrics

## 2023-11-17 ENCOUNTER — Ambulatory Visit
Admission: RE | Admit: 2023-11-17 | Discharge: 2023-11-17 | Disposition: A | Discharge status: Home / Self Care | Payer: MEDICAID | Source: Ambulatory Visit | Attending: Obstetrics | Admitting: Obstetrics

## 2023-11-17 ENCOUNTER — Other Ambulatory Visit: Payer: Self-pay | Admitting: Obstetrics

## 2023-11-17 DIAGNOSIS — N6310 Unspecified lump in the right breast, unspecified quadrant: Secondary | ICD-10-CM

## 2023-11-17 DIAGNOSIS — Z1231 Encounter for screening mammogram for malignant neoplasm of breast: Secondary | ICD-10-CM | POA: Insufficient documentation

## 2024-01-04 ENCOUNTER — Ambulatory Visit (INDEPENDENT_AMBULATORY_CARE_PROVIDER_SITE_OTHER): Payer: MEDICAID | Admitting: Dermatology

## 2024-01-04 DIAGNOSIS — Z7189 Other specified counseling: Secondary | ICD-10-CM

## 2024-01-04 DIAGNOSIS — L719 Rosacea, unspecified: Secondary | ICD-10-CM

## 2024-01-04 DIAGNOSIS — L80 Vitiligo: Secondary | ICD-10-CM

## 2024-01-04 DIAGNOSIS — L700 Acne vulgaris: Secondary | ICD-10-CM | POA: Diagnosis not present

## 2024-01-04 DIAGNOSIS — L299 Pruritus, unspecified: Secondary | ICD-10-CM

## 2024-01-04 DIAGNOSIS — L219 Seborrheic dermatitis, unspecified: Secondary | ICD-10-CM

## 2024-01-04 DIAGNOSIS — Z79899 Other long term (current) drug therapy: Secondary | ICD-10-CM

## 2024-01-04 MED ORDER — METRONIDAZOLE 0.75 % EX CREA: TOPICAL_CREAM | Freq: Two times a day (BID) | CUTANEOUS | 5 refills | Status: AC

## 2024-01-04 MED ORDER — KETOCONAZOLE 2 % EX SHAM: MEDICATED_SHAMPOO | CUTANEOUS | 11 refills | Status: AC

## 2024-01-04 MED ORDER — MOMETASONE FUROATE 0.1 % EX SOLN: CUTANEOUS | 5 refills | Status: DC

## 2024-01-04 MED ORDER — OPZELURA 1.5 % EX CREA: TOPICAL_CREAM | CUTANEOUS | 5 refills | Status: AC

## 2024-01-04 NOTE — Patient Instructions (Signed)

## 2024-01-04 NOTE — Progress Notes (Signed)
Follow-Up Visit   Subjective  Whitney [name] is a 48 y.o. female who presents for the following: white spots of the hands and face - patient ran out of topical medications and light box caused a rash so she stopped coming to the office for light box. Pt has had significant improvement with repigmentation of her vitiligo in many areas. She would like to discuss treatment of acne today. Her seborrheic dermatitis of the scalp with pruritus tends to improve with treatment but she is out of medication.  It has flared.  The following portions of the chart were reviewed this encounter and updated as appropriate: medications, allergies, medical history  Review of Systems:  No other skin or systemic complaints except as noted in HPI or Assessment and Plan.  Objective  Well appearing patient in no apparent distress; mood and affect are within normal limits.  A focused examination was performed of the following areas: The face and hands  Relevant physical exam findings are noted in the assessment and plan.    Assessment & Plan   ACNE VULGARIS   VITILIGO   Related Medications tacrolimus (PROTOPIC) 0.1 % ointment Apply to Hands, wrist and neck daily for vitiligo and once daily to chest for rash ROSACEA   SEBORRHEIC DERMATITIS   PRURITUS   COUNSELING AND COORDINATION OF CARE   MEDICATION MANAGEMENT   VITILIGO             Exam: depigmented patches on the hands and chest  Vitiligo is a chronic autoimmune condition which causes loss of skin pigment and is commonly seen on the face and may also involve areas of trauma like hands, elbows, knees, and ankles. There is no cure and it is difficult to treat.  Treatments include topical steroids and other topical anti-inflammatory ointments/creams and topical and oral Jak inhibitors.  Sometimes narrow band UV light therapy or Xtrac laser is helpful, both of which require twice weekly treatments for at least 3-6 months.   Antioxidant vitamins, such as Vitamins C,E,D, Folic Acid, B12, and alpha lipoic acid may be added to enhance treatment. Heliocare may also enhance treatment results.   Treatment Plan: Restart Opzelura cream BID to aa's. If not covered restart Tacrolimus 0.1% ointment and Mometasone 0.1% cream to aa's BID.   ACNE VULGARIS/rosacea Exam: Small pink papules scattered mostly on the cheeks.  Chronic and persistent condition with duration or expected duration over one year. Condition is bothersome/symptomatic for patient. Currently flared.  Treatment Plan: Start Metronidazole 0.75% cream BID.   SEBORRHEIC DERMATITIS with pruritus  Exam: Pink patches with greasy scale at scalp    Chronic and persistent condition with duration or expected duration over one year. Condition is symptomatic/ bothersome to patient. Not currently at goal.  Improving   Seborrheic Dermatitis is a chronic persistent rash characterized by pinkness and scaling most commonly of the mid face but also can occur on the scalp (dandruff), ears; mid chest, mid back and groin.  It tends to be exacerbated by stress and cooler weather.  People who have neurologic disease may experience new onset or exacerbation of existing seborrheic dermatitis.  The condition is not curable but treatable and can be controlled.   Treatment Plan: Continue Ketoconazole 2% shampoo let sit 5-10 minutes then wash out. Use 3 days per week.  Continue Mometasone 0.1% lotion apply to itchy areas of scalp 3 - 4 days per week.  Long term medication management.  Patient is using long term (months to years) prescription  medication  to control their dermatologic condition.  These medications require periodic monitoring to evaluate for efficacy and side effects and may require periodic laboratory monitoring.  If patient has an issue picking up prescriptions from [facility] she requests that we sent then to [facility] off of [address] instead.  Return in about 6  months (around 07/03/2024).  Maylene Roes, CMA, am acting as scribe for Armida Sans, MD .   Documentation: I have reviewed the above documentation for accuracy and completeness, and I agree with the above.  Armida Sans, MD

## 2024-01-05 ENCOUNTER — Other Ambulatory Visit: Payer: MEDICAID

## 2024-01-05 ENCOUNTER — Ambulatory Visit: Payer: MEDICAID | Admitting: Dermatology

## 2024-01-10 ENCOUNTER — Ambulatory Visit
Admission: RE | Admit: 2024-01-10 | Discharge: 2024-01-10 | Disposition: A | Discharge status: Home / Self Care | Payer: MEDICAID | Source: Ambulatory Visit | Attending: Obstetrics

## 2024-01-10 ENCOUNTER — Telehealth: Payer: Self-pay

## 2024-01-10 ENCOUNTER — Ambulatory Visit
Admission: RE | Admit: 2024-01-10 | Discharge: 2024-01-10 | Disposition: A | Discharge status: Home / Self Care | Payer: MEDICAID | Source: Ambulatory Visit | Attending: Obstetrics | Admitting: Obstetrics

## 2024-01-10 DIAGNOSIS — N6320 Unspecified lump in the left breast, unspecified quadrant: Secondary | ICD-10-CM | POA: Diagnosis present

## 2024-01-10 DIAGNOSIS — N6310 Unspecified lump in the right breast, unspecified quadrant: Secondary | ICD-10-CM | POA: Insufficient documentation

## 2024-01-10 DIAGNOSIS — L80 Vitiligo: Secondary | ICD-10-CM

## 2024-01-10 MED ORDER — TACROLIMUS 0.1 % EX OINT: TOPICAL_OINTMENT | CUTANEOUS | 2 refills | Status: DC

## 2024-01-10 NOTE — Telephone Encounter (Signed)
Opzelura PA denied. Tacrolimus sent in as replacement per last office visit note. aw

## 2024-01-25 ENCOUNTER — Encounter: Payer: Self-pay | Admitting: Dermatology

## 2024-08-02 ENCOUNTER — Ambulatory Visit (INDEPENDENT_AMBULATORY_CARE_PROVIDER_SITE_OTHER): Payer: MEDICAID | Admitting: Dermatology

## 2024-08-02 DIAGNOSIS — L719 Rosacea, unspecified: Secondary | ICD-10-CM

## 2024-08-02 DIAGNOSIS — L219 Seborrheic dermatitis, unspecified: Secondary | ICD-10-CM | POA: Diagnosis not present

## 2024-08-02 DIAGNOSIS — L700 Acne vulgaris: Secondary | ICD-10-CM

## 2024-08-02 DIAGNOSIS — Z7189 Other specified counseling: Secondary | ICD-10-CM

## 2024-08-02 DIAGNOSIS — L80 Vitiligo: Secondary | ICD-10-CM | POA: Diagnosis not present

## 2024-08-02 DIAGNOSIS — Z79899 Other long term (current) drug therapy: Secondary | ICD-10-CM

## 2024-08-02 MED ORDER — MOMETASONE FUROATE 0.1 % EX SOLN: CUTANEOUS | 5 refills | Status: AC

## 2024-08-02 MED ORDER — DOXYCYCLINE HYCLATE 20 MG PO TABS
ORAL_TABLET | ORAL | 2 refills | Status: AC
Start: 2024-08-02 — End: ?

## 2024-08-02 MED ORDER — TACROLIMUS 0.1 % EX OINT: TOPICAL_OINTMENT | CUTANEOUS | 2 refills | Status: AC

## 2024-08-02 NOTE — Patient Instructions (Addendum)
 For rosacea   Start doxycycline  20 mg - take 1 to 2 tabs by mouth daily with food and drink for rosacea and acne at face  Doxycycline  should be taken with food to prevent nausea. Do not lay down for 30 minutes after taking. Be cautious with sun exposure and use good sun protection while on this medication. Pregnant women should not take this medication.   For seborrheic dermatitis   Continue ketoconazole  shampoo 2 % use 3 times a week to scalp let sit on scalp for 5 to 10 minutes before rinsing out  Continue mometasone  0.1 % lotion apply to itchy areas on scalp 3 to 4 days per week  Topical steroids (such as triamcinolone, fluocinolone, fluocinonide, mometasone , clobetasol, halobetasol, betamethasone , hydrocortisone) can cause thinning and lightening of the skin if they are used for too long in the same area. Your physician has selected the right strength medicine for your problem and area affected on the body. Please use your medication only as directed by your physician to prevent side effects.    For vitiligo   ContinueTacrolimus 0.1% ointment  to affected areas twice daily and  Mometasone  0.1% cream to affected areas of vitiligo twice daily        Due to recent changes in healthcare laws, you may see results of your pathology and/or laboratory studies on MyChart before the doctors have had a chance to review them. We understand that in some cases there may be results that are confusing or concerning to you. Please understand that not all results are received at the same time and often the doctors may need to interpret multiple results in order to provide you with the best plan of care or course of treatment. Therefore, we ask that you please give us  2 business days to thoroughly review all your results before contacting the office for clarification. Should we see a critical lab result, you will be contacted sooner.   If You Need Anything After Your Visit  If you have any questions  or concerns for your doctor, please call our main line at [phone] and press option 4 to reach your doctor's medical assistant. If no one answers, please leave a voicemail as directed and we will return your call as soon as possible. Messages left after 4 pm will be answered the following business day.   You may also send us  a message via MyChart. We typically respond to MyChart messages within 1-2 business days.  For prescription refills, please ask your pharmacy to contact our office. Our fax number is [phone].  If you have an urgent issue when the clinic is closed that cannot wait until the next business day, you can page your doctor at the number below.    Please note that while we do our best to be available for urgent issues outside of office hours, we are not available 24/7.   If you have an urgent issue and are unable to reach us , you may choose to seek medical care at your doctor's office, retail clinic, urgent care center, or emergency room.  If you have a medical emergency, please immediately call 911 or go to the emergency department.  Pager Numbers  - Dr. Hester: [phone]  - Dr. Jackquline: [phone]  - Dr. Claudene: [phone]   - Dr. Raymund: [phone]  In the event of inclement weather, please call our main line at [phone] for an update on the status of any delays or closures.  Dermatology Medication Tips: Please keep  the boxes that topical medications come in in order to help keep track of the instructions about where and how to use these. Pharmacies typically print the medication instructions only on the boxes and not directly on the medication tubes.   If your medication is too expensive, please contact our office at [phone] option 4 or send us  a message through MyChart.   We are unable to tell what your co-pay for medications will be in advance as this is different depending on your insurance coverage. However, we may be able to find a  substitute medication at lower cost or fill out paperwork to get insurance to cover a needed medication.   If a prior authorization is required to get your medication covered by your insurance company, please allow us  1-2 business days to complete this process.  Drug prices often vary depending on where the prescription is filled and some pharmacies may offer cheaper prices.  The website www.goodrx.com contains coupons for medications through different pharmacies. The prices here do not account for what the cost may be with help from insurance (it may be cheaper with your insurance), but the website can give you the price if you did not use any insurance.  - You can print the associated coupon and take it with your prescription to the pharmacy.  - You may also stop by our office during regular business hours and pick up a GoodRx coupon card.  - If you need your prescription sent electronically to a different pharmacy, notify our office through [facility] or by phone at [phone] option 4.     Si Usted Necesita Algo Despus de Su Visita  Tambin puede enviarnos un mensaje a travs de Clinical cytogeneticist. Por lo general respondemos a los mensajes de MyChart en el transcurso de 1 a 2 das hbiles.  Para renovar recetas, por favor pida a su farmacia que se ponga en contacto con nuestra oficina. Randi lakes de fax es Gideon [phone].  Si tiene un asunto urgente cuando la clnica est cerrada y que no puede esperar hasta el siguiente da hbil, puede llamar/localizar a su doctor(a) al nmero que aparece a continuacin.   Por favor, tenga en cuenta que aunque hacemos todo lo posible para estar disponibles para asuntos urgentes fuera del horario de [place], no estamos disponibles las 24 horas del da, los 7 [address]  [address] de la [place].   Si tiene un problema urgente y no puede comunicarse con nosotros, puede optar por buscar atencin mdica  en el consultorio de su doctor(a), en una clnica privada, en un  centro de atencin urgente o en una sala de emergencias.  Si tiene Engineer, drilling, por favor llame inmediatamente al 911 o vaya a la sala de emergencias.  Nmeros de bper  - Dr. Hester: [phone]  - Dra. Jackquline: [phone]  - Dr. Claudene: [phone]  - Dra. Kitts: [phone]  En caso de inclemencias del [place], por favor llame a nuestra lnea principal al [phone] para una actualizacin sobre el estado de cualquier retraso o cierre.  Consejos para la medicacin en dermatologa: Por favor, guarde las cajas en las que vienen los medicamentos de uso tpico para ayudarle a seguir las instrucciones sobre dnde y cmo usarlos. Las farmacias generalmente imprimen las instrucciones del medicamento slo en las cajas y no directamente en los tubos del [place].   Si su medicamento es muy caro, por favor, pngase en contacto con landry rieger llamando al [phone] y presione la opcin 4 o envenos un  mensaje a travs de MyChart.   No podemos decirle cul ser su copago por los medicamentos por adelantado ya que esto es diferente dependiendo de la cobertura de su seguro. Sin embargo, es posible que podamos encontrar un medicamento sustituto a Audiological scientist un formulario para que el seguro cubra el medicamento que se considera necesario.   Si se requiere una autorizacin previa para que su compaa de seguros malta su medicamento, por favor permtanos de 1 a 2 das hbiles para completar este proceso.  Los precios de los medicamentos varan con frecuencia dependiendo del Environmental consultant de dnde se surte la receta y alguna farmacias pueden ofrecer precios ms baratos.  El sitio web www.goodrx.com tiene cupones para medicamentos de Health and safety inspector. Los precios aqu no tienen en cuenta lo que podra costar con la ayuda del seguro (puede ser ms barato con su seguro), pero el sitio web puede darle el precio si no utiliz Tourist information centre manager.  - Puede imprimir el cupn  correspondiente y llevarlo con su receta a la farmacia.  - Tambin puede pasar por nuestra oficina durante el horario de atencin regular y Education officer, museum una tarjeta de cupones de GoodRx.  - Si necesita que su receta se enve electrnicamente a una farmacia diferente, informe a nuestra oficina a travs de MyChart de [place] o por telfono llamando al [phone] y presione la opcin 4.

## 2024-08-02 NOTE — Progress Notes (Unsigned)
 Follow-Up Visit   Subjective  Whitney [name] is a 48 y.o. female who presents for the following:  Patient here today with interpretor for follow up on vitiligo, patient reports she is using tacrolimus  and mometasone  cream at affected areas twice daily. Unable to get rx of opzelura  due to price and insurance.  For seborrheic dermatitis at scalp she is using ketoconazole  shampoo and mometasone  lotion as needed for itch  For acne/rosacea at cheeks she did not start metronidazole  0.75 % cream. Still having some flares today and would be interested in pill treatment.   The following portions of the chart were reviewed this encounter and updated as appropriate: medications, allergies, medical history  Review of Systems:  No other skin or systemic complaints except as noted in HPI or Assessment and Plan.  Objective  Well appearing patient in no apparent distress; mood and affect are within normal limits.    A focused examination was performed of the following areas: Back, chest, arms, face, scalp  Relevant exam findings are noted in the Assessment and Plan.  Chest - Trunk and extremities depigmented patches on the hands and chest Compared to previous photos 01/04/2024 visit   Assessment & Plan   ACNE VULGARIS/ ROSACEA Exam: still some active papules at cheeks  Chronic and persistent condition with duration or expected duration over one year. Condition is bothersome/symptomatic for patient. Currently flared. Treatment Plan: Start doxycycline  20 mg tab - take 1 to 2 tab po qd with food and drink.  Do not take 8 hours before or 8 hours after taking iron supplement  Patient would like 3 month supply (180 tabs with 2 rfs sent to [facility] in [place])  Doxycycline  should be taken with food to prevent nausea. Do not lay down for 30 minutes after taking. Be cautious with sun exposure and use good sun protection while on this medication. Pregnant women should not take this medication.     SEBORRHEIC DERMATITIS with pruritus  scalp Exam: Pink patches with greasy scale at scalp  Chronic and persistent condition with duration or expected duration over one year. Condition is symptomatic/ bothersome to patient. Not currently at goal.  Improving Seborrheic Dermatitis is a chronic persistent rash characterized by pinkness and scaling most commonly of the mid face but also can occur on the scalp (dandruff), ears; mid chest, mid back and groin.  It tends to be exacerbated by stress and cooler weather.  People who have neurologic disease may experience new onset or exacerbation of existing seborrheic dermatitis.  The condition is not curable but treatable and can be controlled. Treatment Plan: Re-start Ketoconazole  2% shampoo let sit 5-10 minutes then wash out. Use 3 days per week.  Re-start Mometasone  0.1% lotion apply to itchy areas of scalp 3 - 4 days per week.  Topical steroids (such as triamcinolone, fluocinolone, fluocinonide, mometasone , clobetasol, halobetasol, betamethasone , hydrocortisone) can cause thinning and lightening of the skin if they are used for too long in the same area. Your physician has selected the right strength medicine for your problem and area affected on the body. Please use your medication only as directed by your physician to prevent side effects.  Long term medication management.  Patient is using long term (months to years) prescription medication  to control their dermatologic condition.  These medications require periodic monitoring to evaluate for efficacy and side effects and may require periodic laboratory monitoring.   VITILIGO Chest - Trunk and extremities Chronic and persistent condition with duration or expected  duration over one year. Condition is symptomatic / bothersome to patient. Not to goal. Vitiligo is a chronic autoimmune condition which causes loss of skin pigment and is commonly seen on the face and may also involve areas of trauma like  hands, elbows, knees, and ankles. There is no cure and it is difficult to treat.  Treatments include topical steroids and other topical anti-inflammatory ointments/creams and topical and oral Jak inhibitors.  Sometimes narrow band UV light therapy or Xtrac laser is helpful, both of which require twice weekly treatments for at least 3-6 months.  Antioxidant vitamins, such as Vitamins C,E,D, Folic Acid, B12, and Alpha Lipoic Acid may be added to enhance treatment. Heliocare may also enhance treatment results.  Has improved but still itchy     Treatment Plan: Unable to get rx of opzelura  cream due to cost and insurance Discussed that sometime soon there may be oral jak inhibitors FDA approved for Vitiligo, but not currently FDA approved in the [place]  for Vitiligo. Patient was not covered for opzelura   ContinueTacrolimus 0.1% ointment  to affected areas bid 7 days per week  and  Mometasone  0.1% cream to aa's BID up to 5 days per week aa .  Opzelura  cream bid if affordable and if she can get it approved by insurance.  Topical steroids (such as triamcinolone, fluocinolone, fluocinonide, mometasone , clobetasol, halobetasol, betamethasone , hydrocortisone) can cause thinning and lightening of the skin if they are used for too long in the same area. Your physician has selected the right strength medicine for your problem and area affected on the body. Please use your medication only as directed by your physician to prevent side effects.  Related Medications tacrolimus  (PROTOPIC ) 0.1 % ointment Apply to Hands, wrist and neck daily for vitiligo and once daily to chest for vitiligo rash SEBORRHEIC DERMATITIS   Related Medications mometasone  (ELOCON ) 0.1 % lotion Apply topically to itchy areas of scalp 3 to 4 days weekly prn for seborrheic dermatitis ACNE VULGARIS   Related Medications doxycycline  (PERIOSTAT ) 20 MG tablet Take 1 to 2 times tabs daily with food and drink for rosacea and acne at  face. Do not take 8 hours before or after taking iron supplements. For rosacea and acne at face ROSACEA   Related Medications doxycycline  (PERIOSTAT ) 20 MG tablet Take 1 to 2 times tabs daily with food and drink for rosacea and acne at face. Do not take 8 hours before or after taking iron supplements. For rosacea and acne at face  Return in about 6 months (around 02/02/2025) for vitiligo , acne rosacea, seb derm .  IEleanor Blush, CMA, am acting as scribe for Alm Rhyme, MD.   Documentation: I have reviewed the above documentation for accuracy and completeness, and I agree with the above.  Alm Rhyme, MD

## 2024-08-03 ENCOUNTER — Encounter: Payer: Self-pay | Admitting: Dermatology

## 2024-08-03 ENCOUNTER — Other Ambulatory Visit: Payer: Self-pay

## 2024-08-03 MED ORDER — OPZELURA 1.5 % EX CREA: TOPICAL_CREAM | CUTANEOUS | 4 refills | Status: AC

## 2024-08-03 NOTE — Addendum Note (Signed)
 Addended by: TANDA SETTER A on: 08/03/2024 02:38 PM   Modules accepted: Orders

## 2025-02-07 ENCOUNTER — Ambulatory Visit: Payer: MEDICAID | Admitting: Dermatology
